# Patient Record
Sex: Female | Born: 1965 | Race: White | Marital: Single | Smoking: Never smoker
Health system: Southern US, Community
[De-identification: ages and names within clinical notes are randomized; demographics above are authoritative.]

## PROBLEM LIST (undated history)

## (undated) DIAGNOSIS — F329 Major depressive disorder, single episode, unspecified: Secondary | ICD-10-CM

## (undated) DIAGNOSIS — M869 Osteomyelitis, unspecified: Secondary | ICD-10-CM

## (undated) DIAGNOSIS — K5792 Diverticulitis of intestine, part unspecified, without perforation or abscess without bleeding: Secondary | ICD-10-CM

## (undated) DIAGNOSIS — F32A Depression, unspecified: Secondary | ICD-10-CM

## (undated) HISTORY — PX: HIP SURGERY: SHX245

## (undated) HISTORY — PX: ABDOMINAL HYSTERECTOMY: SHX81

## (undated) HISTORY — PX: ANKLE SURGERY: SHX546

---

## 2014-01-22 ENCOUNTER — Other Ambulatory Visit: Payer: Self-pay | Admitting: Gastroenterology

## 2014-01-22 DIAGNOSIS — R1032 Left lower quadrant pain: Secondary | ICD-10-CM

## 2014-01-24 ENCOUNTER — Ambulatory Visit
Admission: RE | Admit: 2014-01-24 | Discharge: 2014-01-24 | Disposition: A | Discharge status: Home / Self Care | Payer: 59 | Source: Ambulatory Visit | Attending: Gastroenterology | Admitting: Gastroenterology

## 2014-01-24 DIAGNOSIS — R1032 Left lower quadrant pain: Secondary | ICD-10-CM

## 2014-01-24 MED ORDER — IOHEXOL 300 MG/ML  SOLN
125.0000 mL | Freq: Once | INTRAMUSCULAR | Status: AC | PRN
  Administered 2014-01-24: 125 mL via INTRAVENOUS

## 2014-04-10 ENCOUNTER — Encounter (HOSPITAL_COMMUNITY): Payer: Self-pay | Admitting: Emergency Medicine

## 2014-04-10 ENCOUNTER — Inpatient Hospital Stay (HOSPITAL_COMMUNITY)
Admission: EM | Admit: 2014-04-10 | Discharge: 2014-04-13 | DRG: 418 | Disposition: A | Discharge status: Home / Self Care | Payer: 59 | Attending: General Surgery | Admitting: General Surgery

## 2014-04-10 DIAGNOSIS — K8050 Calculus of bile duct without cholangitis or cholecystitis without obstruction: Secondary | ICD-10-CM | POA: Diagnosis present

## 2014-04-10 DIAGNOSIS — K5900 Constipation, unspecified: Secondary | ICD-10-CM | POA: Diagnosis present

## 2014-04-10 DIAGNOSIS — K810 Acute cholecystitis: Secondary | ICD-10-CM | POA: Diagnosis present

## 2014-04-10 DIAGNOSIS — K8062 Calculus of gallbladder and bile duct with acute cholecystitis without obstruction: Principal | ICD-10-CM | POA: Diagnosis present

## 2014-04-10 DIAGNOSIS — Z6841 Body Mass Index (BMI) 40.0 and over, adult: Secondary | ICD-10-CM

## 2014-04-10 HISTORY — DX: Depression, unspecified: F32.A

## 2014-04-10 HISTORY — DX: Diverticulitis of intestine, part unspecified, without perforation or abscess without bleeding: K57.92

## 2014-04-10 HISTORY — DX: Osteomyelitis, unspecified: M86.9

## 2014-04-10 HISTORY — DX: Major depressive disorder, single episode, unspecified: F32.9

## 2014-04-10 NOTE — ED Notes (Signed)
Pt in c/o right abd pain and flank pain that started earlier today and has gotten progressively worse, denies n/v, pt visibly uncomfortable in triage, hx of diverticulitis but states this feels different

## 2014-04-11 ENCOUNTER — Emergency Department (HOSPITAL_COMMUNITY): Payer: 59

## 2014-04-11 ENCOUNTER — Inpatient Hospital Stay (HOSPITAL_COMMUNITY): Payer: 59

## 2014-04-11 ENCOUNTER — Encounter (HOSPITAL_COMMUNITY): Payer: Self-pay | Admitting: Radiology

## 2014-04-11 DIAGNOSIS — K811 Chronic cholecystitis: Secondary | ICD-10-CM

## 2014-04-11 DIAGNOSIS — K8050 Calculus of bile duct without cholangitis or cholecystitis without obstruction: Secondary | ICD-10-CM

## 2014-04-11 DIAGNOSIS — R1011 Right upper quadrant pain: Secondary | ICD-10-CM

## 2014-04-11 DIAGNOSIS — K8020 Calculus of gallbladder without cholecystitis without obstruction: Secondary | ICD-10-CM

## 2014-04-11 LAB — CBC WITH DIFFERENTIAL/PLATELET
BASOS ABS: 0 10*3/uL (ref 0.0–0.1)
BASOS PCT: 0 % (ref 0–1)
Basophils Absolute: 0 10*3/uL (ref 0.0–0.1)
Basophils Relative: 1 % (ref 0–1)
EOS ABS: 0.1 10*3/uL (ref 0.0–0.7)
Eosinophils Absolute: 0 10*3/uL (ref 0.0–0.7)
Eosinophils Relative: 1 % (ref 0–5)
Eosinophils Relative: 0 % (ref 0–5)
HCT: 39.4 % (ref 36.0–46.0)
HEMATOCRIT: 40 % (ref 36.0–46.0)
Hemoglobin: 13.7 g/dL (ref 12.0–15.0)
Hemoglobin: 13.2 g/dL (ref 12.0–15.0)
LYMPHS ABS: 1.7 10*3/uL (ref 0.7–4.0)
LYMPHS PCT: 21 % (ref 12–46)
Lymphocytes Relative: 19 % (ref 12–46)
Lymphs Abs: 2 10*3/uL (ref 0.7–4.0)
MCH: 30.6 pg (ref 26.0–34.0)
MCH: 29.8 pg (ref 26.0–34.0)
MCHC: 34.3 g/dL (ref 30.0–36.0)
MCHC: 33.5 g/dL (ref 30.0–36.0)
MCV: 89.5 fL (ref 78.0–100.0)
MCV: 88.9 fL (ref 78.0–100.0)
MONO ABS: 0.9 10*3/uL (ref 0.1–1.0)
Monocytes Absolute: 0.6 10*3/uL (ref 0.1–1.0)
Monocytes Relative: 9 % (ref 3–12)
Monocytes Relative: 7 % (ref 3–12)
NEUTROS PCT: 71 % (ref 43–77)
Neutro Abs: 7.5 10*3/uL (ref 1.7–7.7)
Neutro Abs: 5.5 10*3/uL (ref 1.7–7.7)
Neutrophils Relative %: 71 % (ref 43–77)
PLATELETS: 303 10*3/uL (ref 150–400)
Platelets: 287 10*3/uL (ref 150–400)
RBC: 4.47 MIL/uL (ref 3.87–5.11)
RBC: 4.43 MIL/uL (ref 3.87–5.11)
RDW: 13 % (ref 11.5–15.5)
RDW: 12.9 % (ref 11.5–15.5)
WBC: 10.5 10*3/uL (ref 4.0–10.5)
WBC: 7.8 10*3/uL (ref 4.0–10.5)

## 2014-04-11 LAB — COMPREHENSIVE METABOLIC PANEL
ALBUMIN: 3.9 g/dL (ref 3.5–5.2)
ALT: 31 U/L (ref 0–35)
ANION GAP: 14 (ref 5–15)
AST: 25 U/L (ref 0–37)
Alkaline Phosphatase: 53 U/L (ref 39–117)
BUN: 13 mg/dL (ref 6–23)
CALCIUM: 10 mg/dL (ref 8.4–10.5)
CO2: 26 mEq/L (ref 19–32)
CREATININE: 0.6 mg/dL (ref 0.50–1.10)
Chloride: 98 mEq/L (ref 96–112)
GFR calc Af Amer: >90 mL/min (ref >90)
GFR calc non Af Amer: >90 mL/min (ref >90)
Glucose, Bld: 115 mg/dL — ABNORMAL HIGH (ref 70–99)
Potassium: 4 mEq/L (ref 3.7–5.3)
Sodium: 138 mEq/L (ref 137–147)
TOTAL PROTEIN: 7.2 g/dL (ref 6.0–8.3)
Total Bilirubin: 0.3 mg/dL (ref 0.3–1.2)

## 2014-04-11 LAB — BASIC METABOLIC PANEL
ANION GAP: 14 (ref 5–15)
BUN: 10 mg/dL (ref 6–23)
CALCIUM: 9.7 mg/dL (ref 8.4–10.5)
CO2: 26 mEq/L (ref 19–32)
Chloride: 97 mEq/L (ref 96–112)
Creatinine, Ser: 0.59 mg/dL (ref 0.50–1.10)
Glucose, Bld: 115 mg/dL — ABNORMAL HIGH (ref 70–99)
POTASSIUM: 4.6 meq/L (ref 3.7–5.3)
SODIUM: 137 meq/L (ref 137–147)

## 2014-04-11 LAB — HEPATIC FUNCTION PANEL
ALBUMIN: 3.7 g/dL (ref 3.5–5.2)
ALK PHOS: 55 U/L (ref 39–117)
ALK PHOS: 54 U/L (ref 39–117)
ALT: 43 U/L — ABNORMAL HIGH (ref 0–35)
ALT: 39 U/L — AB (ref 0–35)
AST: 30 U/L (ref 0–37)
AST: 25 U/L (ref 0–37)
Albumin: 3.8 g/dL (ref 3.5–5.2)
BILIRUBIN TOTAL: 0.3 mg/dL (ref 0.3–1.2)
Bilirubin, Direct: <0.2 mg/dL (ref 0.0–0.3)
Bilirubin, Direct: <0.2 mg/dL (ref 0.0–0.3)
Total Bilirubin: 0.3 mg/dL (ref 0.3–1.2)
Total Protein: 7.2 g/dL (ref 6.0–8.3)
Total Protein: 7.2 g/dL (ref 6.0–8.3)

## 2014-04-11 LAB — LIPASE, BLOOD: Lipase: 42 U/L (ref 11–59)

## 2014-04-11 LAB — URINALYSIS, ROUTINE W REFLEX MICROSCOPIC
Bilirubin Urine: NEGATIVE
Glucose, UA: NEGATIVE mg/dL
HGB URINE DIPSTICK: NEGATIVE
Ketones, ur: NEGATIVE mg/dL
LEUKOCYTES UA: NEGATIVE
Nitrite: NEGATIVE
PROTEIN: NEGATIVE mg/dL
Specific Gravity, Urine: 1.02 (ref 1.005–1.030)
UROBILINOGEN UA: 0.2 mg/dL (ref 0.0–1.0)
pH: 6.5 (ref 5.0–8.0)

## 2014-04-11 LAB — TROPONIN I

## 2014-04-11 LAB — GLUCOSE, CAPILLARY: GLUCOSE-CAPILLARY: 126 mg/dL — AB (ref 70–99)

## 2014-04-11 MED ORDER — ACETAMINOPHEN 325 MG PO TABS: 650.0000 mg | ORAL_TABLET | Freq: Four times a day (QID) | ORAL | Status: DC | PRN

## 2014-04-11 MED ORDER — BIOTENE DRY MOUTH MT LIQD
15.0000 mL | Freq: Two times a day (BID) | OROMUCOSAL | Status: DC
  Administered 2014-04-13: 15 mL via OROMUCOSAL

## 2014-04-11 MED ORDER — ONDANSETRON HCL 4 MG PO TABS: 4.0000 mg | ORAL_TABLET | Freq: Four times a day (QID) | ORAL | Status: DC | PRN

## 2014-04-11 MED ORDER — IOHEXOL 300 MG/ML  SOLN
100.0000 mL | Freq: Once | INTRAMUSCULAR | Status: AC | PRN
  Administered 2014-04-11: 100 mL via INTRAVENOUS

## 2014-04-11 MED ORDER — ZOLPIDEM TARTRATE 5 MG PO TABS
5.0000 mg | ORAL_TABLET | Freq: Once | ORAL | Status: AC
  Administered 2014-04-11: 5 mg via ORAL
  Filled 2014-04-11: qty 1

## 2014-04-11 MED ORDER — MORPHINE SULFATE 2 MG/ML IJ SOLN
1.0000 mg | INTRAMUSCULAR | Status: DC | PRN
  Administered 2014-04-11: 1 mg via INTRAVENOUS
  Filled 2014-04-11: qty 1

## 2014-04-11 MED ORDER — ONDANSETRON HCL 4 MG/2ML IJ SOLN: 4.0000 mg | Freq: Four times a day (QID) | INTRAMUSCULAR | Status: DC | PRN

## 2014-04-11 MED ORDER — CHLORHEXIDINE GLUCONATE 0.12 % MT SOLN
15.0000 mL | Freq: Two times a day (BID) | OROMUCOSAL | Status: DC
  Administered 2014-04-11 – 2014-04-13 (×5): 15 mL via OROMUCOSAL
  Filled 2014-04-11 (×7): qty 15

## 2014-04-11 MED ORDER — GADOBENATE DIMEGLUMINE 529 MG/ML IV SOLN
20.0000 mL | Freq: Once | INTRAVENOUS | Status: AC
  Administered 2014-04-11: 20 mL via INTRAVENOUS

## 2014-04-11 MED ORDER — HYDROMORPHONE HCL PF 1 MG/ML IJ SOLN
1.0000 mg | Freq: Once | INTRAMUSCULAR | Status: AC
  Administered 2014-04-11: 1 mg via INTRAVENOUS
  Filled 2014-04-11: qty 1

## 2014-04-11 MED ORDER — MORPHINE SULFATE 4 MG/ML IJ SOLN
4.0000 mg | Freq: Once | INTRAMUSCULAR | Status: AC
  Administered 2014-04-11: 4 mg via INTRAVENOUS
  Filled 2014-04-11: qty 1

## 2014-04-11 MED ORDER — DEXTROSE-NACL 5-0.9 % IV SOLN
INTRAVENOUS | Status: AC
  Administered 2014-04-11 (×2): via INTRAVENOUS

## 2014-04-11 MED ORDER — METRONIDAZOLE IN NACL 5-0.79 MG/ML-% IV SOLN
500.0000 mg | Freq: Three times a day (TID) | INTRAVENOUS | Status: DC
  Administered 2014-04-11 – 2014-04-12 (×3): 500 mg via INTRAVENOUS
  Filled 2014-04-11 (×5): qty 100

## 2014-04-11 MED ORDER — ACETAMINOPHEN 650 MG RE SUPP: 650.0000 mg | Freq: Four times a day (QID) | RECTAL | Status: DC | PRN

## 2014-04-11 MED ORDER — ONDANSETRON HCL 4 MG/2ML IJ SOLN
4.0000 mg | Freq: Once | INTRAMUSCULAR | Status: AC
  Administered 2014-04-11: 4 mg via INTRAVENOUS
  Filled 2014-04-11: qty 2

## 2014-04-11 MED ORDER — CIPROFLOXACIN IN D5W 400 MG/200ML IV SOLN
400.0000 mg | Freq: Two times a day (BID) | INTRAVENOUS | Status: DC
  Administered 2014-04-11 – 2014-04-13 (×5): 400 mg via INTRAVENOUS
  Filled 2014-04-11 (×7): qty 200

## 2014-04-11 MED ORDER — IOHEXOL 300 MG/ML  SOLN
25.0000 mL | Freq: Once | INTRAMUSCULAR | Status: AC | PRN
  Administered 2014-04-11: 25 mL via ORAL

## 2014-04-11 NOTE — ED Notes (Signed)
CT tech called to inform them she had finished contrast

## 2014-04-11 NOTE — H&P (Signed)
[facility] History and Physical  Dominique Robinson ZOX:[id number] DOB: 1966/08/15 DOA: 04/10/2014  Referring physician: ER physician. PCP: No PCP Per Patient   Chief Complaint: Abdominal pain.  HPI: Dominique Markeratricia Ann Colaizzi is a 48 y.o. female with no significant past medical history presents with the ER because of abdominal pain. Patient's abdominal pain started last evening initially the epigastric area later started to radiate to the back and right upper quadrant. Patient did not have any nausea vomiting but had 2 episodes of diarrhea which was nonbloody. Patient denies any associated chest pain or shortness of breath. In the ER patient had CT abdomen and pelvis which shows choledocholithiasis and sonogram shows cholelithiasis. On-call gastroenterologist Dr. Bosie ClosSchooler was consulted by the ER physician and Dr. Bosie ClosSchooler will be seeing patient in consult for possible ERCP. Patient is admitted for further management. Patient was recently treated 2 months ago for diverticulitis.  Review of Systems: As presented in the history of presenting illness, rest negative.  Past Medical History  Diagnosis Date  . Diverticulitis   . Medical history non-contributory    Past Surgical History  Procedure Laterality Date  . Abdominal hysterectomy     Social History:  reports that she has never smoked. She does not have any smokeless tobacco history on file. She reports that she drinks alcohol. She reports that she does not use illicit drugs. Where does patient live home. Can patient participate in ADLs? Yes.  Allergies  Allergen Reactions  . Erythromycin Rash  . Penicillins Rash    Family History:  Family History  Problem Relation Age of Onset  . Hypertension Mother       Prior to Admission medications   Medication Sig Start Date End Date Taking? Authorizing Provider  acetaminophen (TYLENOL) 325 MG tablet Take 650 mg by mouth every 6 (six) hours as needed for mild pain.   Yes Historical  Provider, MD  Ascorbic Acid (VITAMIN C PO) Take 1 tablet by mouth daily.   Yes Historical Provider, MD  aspirin 325 MG tablet Take 650 mg by mouth daily.   Yes Historical Provider, MD  bismuth subsalicylate (PEPTO BISMOL) 262 MG/15ML suspension Take 30 mLs by mouth every 6 (six) hours as needed for indigestion.   Yes Historical Provider, MD  ibuprofen (ADVIL,MOTRIN) 200 MG tablet Take 400 mg by mouth every 6 (six) hours as needed for mild pain.   Yes Historical Provider, MD  Multiple Vitamin (MULTI VITAMIN DAILY PO) Take 1 tablet by mouth daily.   Yes Historical Provider, MD  Omega-3 Fatty Acids (FISH OIL PO) Take 1 capsule by mouth daily.   Yes Historical Provider, MD    Physical Exam: Filed Vitals:   04/11/14 0220 04/11/14 0300 04/11/14 0504 04/11/14 0615  BP: 140/78 140/86 127/80 132/79  Pulse: 71 65 81 78  Temp:    98.6 F (37 C)  TempSrc:    Oral  Resp: 21 16 14 14   Weight:      SpO2: 99% 99% 99% 98%     General:  Well-developed and nourished.  Eyes: Anicteric no pallor.  ENT: No discharge from the ears eyes nose mouth.  Neck: No mass felt.  Cardiovascular: S1-S2 heard.  Respiratory: No rhonchi or crepitations.  Abdomen: Mild tenderness epigastric area no guarding or rigidity.  Skin: No rash.  Musculoskeletal: No edema.  Psychiatric: Appears normal.  Neurologic: Alert awake oriented to time place and person. Moves all extremities.  Labs on Admission:  Basic Metabolic Panel:  Recent Labs Lab  04/11/14 0100  NA 138  K 4.0  CL 98  CO2 26  GLUCOSE 115*  BUN 13  CREATININE 0.60  CALCIUM 10.0   Liver Function Tests:  Recent Labs Lab 04/11/14 0100  AST 25  ALT 31  ALKPHOS 53  BILITOT 0.3  PROT 7.2  ALBUMIN 3.9    Recent Labs Lab 04/11/14 0100  LIPASE 42   No results found for this basename: AMMONIA,  in the last 168 hours CBC:  Recent Labs Lab 04/11/14 0100  WBC 10.5  NEUTROABS 7.5  HGB 13.7  HCT 40.0  MCV 89.5  PLT 287   Cardiac  Enzymes:  Recent Labs Lab 04/11/14 0100  TROPONINI <0.30    BNP (last 3 results) No results found for this basename: PROBNP,  in the last 8760 hours CBG: No results found for this basename: GLUCAP,  in the last 168 hours  Radiological Exams on Admission: Dg Chest 2 View  04/11/2014   CLINICAL DATA:  ABDOMINAL PAIN  EXAM: CHEST  2 VIEW  COMPARISON:  None.  FINDINGS: The cardiac and mediastinal silhouettes are within normal limits.  The lungs are normally inflated. No airspace consolidation, pleural effusion, or pulmonary edema is identified. There is no pneumothorax.  No acute osseous abnormality identified.  IMPRESSION: No active cardiopulmonary disease.   Electronically Signed   By: Rise MuBenjamin  McClintock M.D.   On: 04/11/2014 01:18   Ct Abdomen Pelvis W Contrast  04/11/2014   CLINICAL DATA:  Right abdominal pain and flank pain. History of diverticulitis.  EXAM: CT ABDOMEN AND PELVIS WITH CONTRAST  TECHNIQUE: Multidetector CT imaging of the abdomen and pelvis was performed using the standard protocol following bolus administration of intravenous contrast.  CONTRAST:  100mL OMNIPAQUE IOHEXOL 300 MG/ML  SOLN  COMPARISON:  Prior CT from 01/24/2014  FINDINGS: Mild subsegmental atelectasis seen dependently within the visualized lung bases.  Liver demonstrates a normal contrast enhanced appearance. Layering gallstones present within the gallbladder lumen. No overt evidence of acute cholecystitis at this time. 5 mm stone is present at the distal common bile duct/ampulla (series 201, image 34). The common bile duct is mildly dilated up to 9 mm. Mild intrahepatic biliary dilatation is present. No pancreatic ductal dilatation.  The spleen, adrenal glands, and pancreas demonstrate a normal contrast enhanced appearance.  Kidneys are equal in size with symmetric enhancement. No nephrolithiasis, hydronephrosis, or focal enhancing renal mass.  Stomach within normal limits. No evidence of bowel obstruction. Appendix  is normal. Colonic diverticulosis present without acute diverticulitis.  Bladder within normal limits. Uterus is absent. Ovaries within normal limits.  No free air or fluid. No enlarged intra-abdominal pelvic lymph nodes.  Normal intravascular enhancement seen throughout the abdomen and pelvis.  No acute osseous abnormality. No worrisome lytic or blastic osseous lesions.  IMPRESSION: 1. Cholelithiasis with impacted 5 mm stone in the distal common bile duct near the ampulla with mild intra and extrahepatic biliary dilatation. No overt CT evidence of acute cholecystitis at this time. Correlation with right upper quadrant ultrasound may be helpful for further evaluation. 2. No other acute intra-abdominal pelvic process.   Electronically Signed   By: Rise MuBenjamin  McClintock M.D.   On: 04/11/2014 04:25   [place] Abdomen Limited Ruq  04/11/2014   CLINICAL DATA:  Cholelithiasis  EXAM: US ABDOMEN LIMITED - RIGHT UPPER QUADRANT  COMPARISON:  Abdominal CT from the same day  FINDINGS: Gallbladder:  There are small (1 cm or smaller) gallstones which appear mobile. There is no wall  thickening or focal tenderness to suggest acute cholecystitis.  Common bile duct:  Diameter: 6 mm. Although the distal common duct is seen fairly well, a previously seen distal common bile duct calculus is not visible. This is likely a technical limitation rather than representing passage of stone.  Liver:  No focal lesion identified. Within normal limits in parenchymal echogenicity.  IMPRESSION: 1. Cholelithiasis without acute cholecystitis. 2. Choledocholithiasis noted on the preceding CT is not visible. This is likely a technical limitation, rather than an indication of stone passage.   Electronically Signed   By: Tiburcio Pea M.D.   On: 04/11/2014 06:20    Assessment/Plan Principal Problem:   Choledocholithiasis   1. Choledocholithiasis with cholelithiasis - patient has been placed n.p.o. in anticipation of possible procedure by  gastroenterologist. I have continued patient on gentle hydration and placeD patient on Cipro empirically for now. Patient will need surgical consult eventually. Follow LFTs and lipase.    Code Status: Full code.  Family Communication: None.  Disposition Plan: Admit to inpatient.    Capone Schwinn N. [facility] Pager [phone].  If 7PM-7AM, please contact night-coverage [email or web address] Password [facility] 04/11/2014, 6:37 AM

## 2014-04-11 NOTE — Progress Notes (Signed)
Pt arrived from unit via stretcher. Pt was oriented to room to unit and call bell system. VS were taken and are stable. Pt in no apparent distress at this time. Will continue to monitor pt.

## 2014-04-11 NOTE — ED Provider Notes (Signed)
CSN: [id number]     Arrival date & time 04/10/14  2349 History   First MD Initiated Contact with Patient 04/11/14 0000     Chief Complaint  Patient presents with  . Abdominal Pain     (Consider location/radiation/quality/duration/timing/severity/associated sxs/prior Treatment) HPI  This is a 48 yo female with history of diverticulitis who presents with abdominal pain. Patient reports onset of abdominal pain earlier today. She states that it started in her epigastrium and moved to her right side. It is worse laying flat. She denies any nausea or vomiting. She does endorse diarrhea yesterday that was nonbloody.  Currently patient rates her pain as 10. It has been constant. She has not noted any urinary symptoms including dysuria or hematuria.  She does not associate the pain with any food. No known history of kidney stones.  Past Medical History  Diagnosis Date  . Diverticulitis    History reviewed. No pertinent past surgical history. History reviewed. No pertinent family history. History  Substance Use Topics  . Smoking status: Never Smoker   . Smokeless tobacco: Not on file  . Alcohol Use: Not on file   OB History   Grav Para Term Preterm Abortions TAB SAB Ect Mult Living                 Review of Systems  Constitutional: Negative for fever.  Respiratory: Negative for cough, chest tightness and shortness of breath.   Cardiovascular: Negative for chest pain.  Gastrointestinal: Positive for abdominal pain and diarrhea. Negative for nausea and vomiting.  Genitourinary: Positive for flank pain. Negative for dysuria and hematuria.  Musculoskeletal: Negative for back pain.  Skin: Negative for wound.  Neurological: Negative for headaches.  All other systems reviewed and are negative.     Allergies  Erythromycin and Penicillins  Home Medications   Prior to Admission medications   Medication Sig Start Date End Date Taking? Authorizing Provider  acetaminophen (TYLENOL) 325 MG  tablet Take 650 mg by mouth every 6 (six) hours as needed for mild pain.   Yes Historical Provider, MD  Ascorbic Acid (VITAMIN C PO) Take 1 tablet by mouth daily.   Yes Historical Provider, MD  aspirin 325 MG tablet Take 650 mg by mouth daily.   Yes Historical Provider, MD  bismuth subsalicylate (PEPTO BISMOL) 262 MG/15ML suspension Take 30 mLs by mouth every 6 (six) hours as needed for indigestion.   Yes Historical Provider, MD  ibuprofen (ADVIL,MOTRIN) 200 MG tablet Take 400 mg by mouth every 6 (six) hours as needed for mild pain.   Yes Historical Provider, MD  Multiple Vitamin (MULTI VITAMIN DAILY PO) Take 1 tablet by mouth daily.   Yes Historical Provider, MD  Omega-3 Fatty Acids (FISH OIL PO) Take 1 capsule by mouth daily.   Yes Historical Provider, MD   BP 127/80  Pulse 81  Temp(Src) 98.1 F (36.7 C) (Oral)  Resp 14  Wt 220 lb (99.791 kg)  SpO2 99% Physical Exam  Nursing note and vitals reviewed. Constitutional: She is oriented to person, place, and time.  Uncomfortable appearing, no acute distress  HENT:  Head: Normocephalic and atraumatic.  Eyes: Pupils are equal, round, and reactive to light.  Cardiovascular: Normal rate, regular rhythm and normal heart sounds.   No murmur heard. Pulmonary/Chest: Effort normal and breath sounds normal. No respiratory distress. She has no wheezes.  Abdominal: Soft. Bowel sounds are normal. There is tenderness. There is no rebound and no guarding.  Mild tenderness of the  epigastrium without rebound or guarding  Neurological: She is alert and oriented to person, place, and time.  Skin: Skin is warm and dry.  Psychiatric: She has a normal mood and affect.    ED Course  Procedures (including critical care time) Labs Review Labs Reviewed  COMPREHENSIVE METABOLIC PANEL - Abnormal; Notable for the following:    Glucose, Bld 115 (*)    All other components within normal limits  CBC WITH DIFFERENTIAL  LIPASE, BLOOD  TROPONIN I  URINALYSIS,  ROUTINE W REFLEX MICROSCOPIC    Imaging Review Dg Chest 2 View  04/11/2014   CLINICAL DATA:  ABDOMINAL PAIN  EXAM: CHEST  2 VIEW  COMPARISON:  None.  FINDINGS: The cardiac and mediastinal silhouettes are within normal limits.  The lungs are normally inflated. No airspace consolidation, pleural effusion, or pulmonary edema is identified. There is no pneumothorax.  No acute osseous abnormality identified.  IMPRESSION: No active cardiopulmonary disease.   Electronically Signed   By: Rise MuBenjamin  McClintock M.D.   On: 04/11/2014 01:18   Ct Abdomen Pelvis W Contrast  04/11/2014   CLINICAL DATA:  Right abdominal pain and flank pain. History of diverticulitis.  EXAM: CT ABDOMEN AND PELVIS WITH CONTRAST  TECHNIQUE: Multidetector CT imaging of the abdomen and pelvis was performed using the standard protocol following bolus administration of intravenous contrast.  CONTRAST:  100mL OMNIPAQUE IOHEXOL 300 MG/ML  SOLN  COMPARISON:  Prior CT from 01/24/2014  FINDINGS: Mild subsegmental atelectasis seen dependently within the visualized lung bases.  Liver demonstrates a normal contrast enhanced appearance. Layering gallstones present within the gallbladder lumen. No overt evidence of acute cholecystitis at this time. 5 mm stone is present at the distal common bile duct/ampulla (series 201, image 34). The common bile duct is mildly dilated up to 9 mm. Mild intrahepatic biliary dilatation is present. No pancreatic ductal dilatation.  The spleen, adrenal glands, and pancreas demonstrate a normal contrast enhanced appearance.  Kidneys are equal in size with symmetric enhancement. No nephrolithiasis, hydronephrosis, or focal enhancing renal mass.  Stomach within normal limits. No evidence of bowel obstruction. Appendix is normal. Colonic diverticulosis present without acute diverticulitis.  Bladder within normal limits. Uterus is absent. Ovaries within normal limits.  No free air or fluid. No enlarged intra-abdominal pelvic lymph  nodes.  Normal intravascular enhancement seen throughout the abdomen and pelvis.  No acute osseous abnormality. No worrisome lytic or blastic osseous lesions.  IMPRESSION: 1. Cholelithiasis with impacted 5 mm stone in the distal common bile duct near the ampulla with mild intra and extrahepatic biliary dilatation. No overt CT evidence of acute cholecystitis at this time. Correlation with right upper quadrant ultrasound may be helpful for further evaluation. 2. No other acute intra-abdominal pelvic process.   Electronically Signed   By: Rise MuBenjamin  McClintock M.D.   On: 04/11/2014 04:25     EKG Interpretation   Date/Time:  Thursday April 11 2014 00:06:49 EDT Ventricular Rate:  55 PR Interval:  194 QRS Duration: 88 QT Interval:  475 QTC Calculation: 454 R Axis:   58 Text Interpretation:  Sinus arrhythmia ST elev, probable normal early  repol pattern No prior for comparison Confirmed by Nyema Hachey  MD, Raziyah Vanvleck  ([id number]) on 04/11/2014 1:39:14 AM      MDM   Final diagnoses:  Choledocholithiasis    Patient presents with abdominal pain. She is nontoxic but uncomfortable appearing on exam. She's afebrile and vital signs are reassuring. No significant tenderness on exam. Mild epigastric tenderness without signs of  peritonitis. Initial considerations include pancreatitis, gastritis, cholecystitis, right-sided kidney stone. Patient was given pain medication and nausea medicine.  Initial workup is reassuring including normal LFTs and lipase. Patient continues to report worsening abdominal pain. For this reason, CT scan of the abdomen was obtained. CT shows a 5 mm stone in impacted in the distal common bile duct.  The common bile duct appears to be dilated to 9 mm. Ultrasound was ordered. Patient's GI doctor is Dr. Carman Ching. Discussed with Dr. Bosie Clos who is on call for [facility]. Recommends admission for further evaluation and possible ERCP.  Patient is n.p.o. Patient does not have primary care physician.  Will admit to the hospitalist.    Shon Baton, MD 04/11/14 [phone]

## 2014-04-11 NOTE — ED Notes (Signed)
Patient transported to X-ray 

## 2014-04-11 NOTE — ED Notes (Signed)
Patient transported to CT 

## 2014-04-11 NOTE — Consult Note (Signed)
Cholelithiasis and has passed CBD stone. Will arrange for lap chole/IOC tomorrow. I will D/W Dr. Lindie SpruceWyatt. Procedure, risk, and benefits D/W her and she agrees. Patient examined and I agree with the assessment and plan  Violeta GelinasBurke Everly Rubalcava, MD, MPH, FACS Trauma: [phone] General Surgery: [phone]  04/11/2014 3:51 PM

## 2014-04-11 NOTE — Consult Note (Signed)
[facility] CONSULT Reason for consult: Abnormal CT Referring Physician: [facility]. PCP: none. Primary G.I.: Dr. Milagros Evener Dominique Robinson is an 48 y.o. female.  HPI: she has recently moved to the [employer] area from [place] to be near her son. 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 I saw her recently in the office about 6 weeks ago with some rectal bleeding constipation and marked left lower quadrant pain. Her clinical exam was consistent with diverticulitis. We obtain labs and her liver tests were normal at that time. She empirically was started on doxycycline BID and CT was obtained. This did reveal mild diverticulitis and sludge in the gallbladder. The patient had had no prior history of biliary disease or symptoms. She improved dramatically with the antibiotics and Miralax. She had a follow-up visit with me in the office next week to discuss diverticulitis, heme positive stools and questionable further workup, and gallstones. She has done well and was asymptomatic until yesterday when she began to have epigastric pain radiating to the back right upper quadrant. She had diarrhea. The pain was severe and doubled her over and in a completely different location than her diverticulitis pain. She came to the ER with the symptoms. CBC revealed white count 10.8 and completely normal liver tests. Specifically alkaline phosphatase, transaminases, and bilirubin were all completely normal yesterday as well as this morning. Lipase was normal. CT scan of the abdomen was repeated in the emergency room revealing gallstones of the gallbladder with what was felt to be a 5 mm stone in the distal common bile duct. The patient does clinically feel better. Ultrasound confirmed cholelthiasis but did not clearly show choledocholithiasis.  Past Medical History  Diagnosis Date  . Diverticulitis   . Medical history non-contributory     Past Surgical History  Procedure Laterality Date  . Abdominal  hysterectomy      Family History  Problem Relation Age of Onset  . Hypertension Mother     Social History:  reports that she has never smoked. She does not have any smokeless tobacco history on file. She reports that she drinks alcohol. She reports that she does not use illicit drugs.  Allergies:  Allergies  Allergen Reactions  . Erythromycin Rash  . Penicillins Rash    Medications; Prior to Admission medications   Medication Sig Start Date End Date Taking? Authorizing Provider  acetaminophen (TYLENOL) 325 MG tablet Take 650 mg by mouth every 6 (six) hours as needed for mild pain.   Yes Historical Provider, MD  Ascorbic Acid (VITAMIN C PO) Take 1 tablet by mouth daily.   Yes Historical Provider, MD  aspirin 325 MG tablet Take 650 mg by mouth daily.   Yes Historical Provider, MD  bismuth subsalicylate (PEPTO BISMOL) 262 MG/15ML suspension Take 30 mLs by mouth every 6 (six) hours as needed for indigestion.   Yes Historical Provider, MD  ibuprofen (ADVIL,MOTRIN) 200 MG tablet Take 400 mg by mouth every 6 (six) hours as needed for mild pain.   Yes Historical Provider, MD  Multiple Vitamin (MULTI VITAMIN DAILY PO) Take 1 tablet by mouth daily.   Yes Historical Provider, MD  Omega-3 Fatty Acids (FISH OIL PO) Take 1 capsule by mouth daily.   Yes Historical Provider, MD   . ciprofloxacin  400 mg Intravenous Q12H   PRN Meds acetaminophen, acetaminophen, morphine injection, ondansetron (ZOFRAN) IV, ondansetron Results for orders placed during the hospital encounter of 04/10/14 (from the past 48 hour(s))  URINALYSIS, ROUTINE W REFLEX MICROSCOPIC  Status: None   Collection Time    04/11/14 12:53 AM      Result Value Ref Range   Color, Urine YELLOW  YELLOW   APPearance CLEAR  CLEAR   Specific Gravity, Urine 1.020  1.005 - 1.030   pH 6.5  5.0 - 8.0   Glucose, UA NEGATIVE  NEGATIVE mg/dL   Hgb urine dipstick NEGATIVE  NEGATIVE   Bilirubin Urine NEGATIVE  NEGATIVE   Ketones, ur  NEGATIVE  NEGATIVE mg/dL   Protein, ur NEGATIVE  NEGATIVE mg/dL   Urobilinogen, UA 0.2  0.0 - 1.0 mg/dL   Nitrite NEGATIVE  NEGATIVE   Leukocytes, UA NEGATIVE  NEGATIVE   Comment: MICROSCOPIC NOT DONE ON URINES WITH NEGATIVE PROTEIN, BLOOD, LEUKOCYTES, NITRITE, OR GLUCOSE <1000 mg/dL.  CBC WITH DIFFERENTIAL     Status: None   Collection Time    04/11/14  1:00 AM      Result Value Ref Range   WBC 10.5  4.0 - 10.5 K/uL   RBC 4.47  3.87 - 5.11 MIL/uL   Hemoglobin 13.7  12.0 - 15.0 g/dL   HCT 40.0  36.0 - 46.0 %   MCV 89.5  78.0 - 100.0 fL   MCH 30.6  26.0 - 34.0 pg   MCHC 34.3  30.0 - 36.0 g/dL   RDW 13.0  11.5 - 15.5 %   Platelets 287  150 - 400 K/uL   Neutrophils Relative % 71  43 - 77 %   Neutro Abs 7.5  1.7 - 7.7 K/uL   Lymphocytes Relative 19  12 - 46 %   Lymphs Abs 2.0  0.7 - 4.0 K/uL   Monocytes Relative 9  3 - 12 %   Monocytes Absolute 0.9  0.1 - 1.0 K/uL   Eosinophils Relative 1  0 - 5 %   Eosinophils Absolute 0.1  0.0 - 0.7 K/uL   Basophils Relative 0  0 - 1 %   Basophils Absolute 0.0  0.0 - 0.1 K/uL  COMPREHENSIVE METABOLIC PANEL     Status: Abnormal   Collection Time    04/11/14  1:00 AM      Result Value Ref Range   Sodium 138  137 - 147 mEq/L   Potassium 4.0  3.7 - 5.3 mEq/L   Chloride 98  96 - 112 mEq/L   CO2 26  19 - 32 mEq/L   Glucose, Bld 115 (*) 70 - 99 mg/dL   BUN 13  6 - 23 mg/dL   Creatinine, Ser 0.60  0.50 - 1.10 mg/dL   Calcium 10.0  8.4 - 10.5 mg/dL   Total Protein 7.2  6.0 - 8.3 g/dL   Albumin 3.9  3.5 - 5.2 g/dL   AST 25  0 - 37 U/L   ALT 31  0 - 35 U/L   Alkaline Phosphatase 53  39 - 117 U/L   Total Bilirubin 0.3  0.3 - 1.2 mg/dL   GFR calc non Af Amer >90  >90 mL/min   GFR calc Af Amer >90  >90 mL/min   Comment: (NOTE)     The eGFR has been calculated using the CKD EPI equation.     This calculation has not been validated in all clinical situations.     eGFR's persistently <90 mL/min signify possible Chronic Kidney     Disease.   Anion  gap 14  5 - 15  LIPASE, BLOOD     Status: None   Collection Time  04/11/14  1:00 AM      Result Value Ref Range   Lipase 42  11 - 59 U/L  TROPONIN I     Status: None   Collection Time    04/11/14  1:00 AM      Result Value Ref Range   Troponin I <0.30  <0.30 ng/mL   Comment:            Due to the release kinetics of cTnI,     a negative result within the first hours     of the onset of symptoms does not rule out     myocardial infarction with certainty.     If myocardial infarction is still suspected,     repeat the test at appropriate intervals.  HEPATIC FUNCTION PANEL     Status: Abnormal   Collection Time    04/11/14  7:25 AM      Result Value Ref Range   Total Protein 7.2  6.0 - 8.3 g/dL   Albumin 3.8  3.5 - 5.2 g/dL   AST 30  0 - 37 U/L   ALT 43 (*) 0 - 35 U/L   Alkaline Phosphatase 55  39 - 117 U/L   Total Bilirubin 0.3  0.3 - 1.2 mg/dL   Bilirubin, Direct <0.2  0.0 - 0.3 mg/dL   Indirect Bilirubin NOT CALCULATED  0.3 - 0.9 mg/dL  BASIC METABOLIC PANEL     Status: Abnormal   Collection Time    04/11/14  7:25 AM      Result Value Ref Range   Sodium 137  137 - 147 mEq/L   Potassium 4.6  3.7 - 5.3 mEq/L   Chloride 97  96 - 112 mEq/L   CO2 26  19 - 32 mEq/L   Glucose, Bld 115 (*) 70 - 99 mg/dL   BUN 10  6 - 23 mg/dL   Creatinine, Ser 0.59  0.50 - 1.10 mg/dL   Calcium 9.7  8.4 - 10.5 mg/dL   GFR calc non Af Amer >90  >90 mL/min   GFR calc Af Amer >90  >90 mL/min   Comment: (NOTE)     The eGFR has been calculated using the CKD EPI equation.     This calculation has not been validated in all clinical situations.     eGFR's persistently <90 mL/min signify possible Chronic Kidney     Disease.   Anion gap 14  5 - 15  CBC WITH DIFFERENTIAL     Status: None   Collection Time    04/11/14  7:25 AM      Result Value Ref Range   WBC 7.8  4.0 - 10.5 K/uL   RBC 4.43  3.87 - 5.11 MIL/uL   Hemoglobin 13.2  12.0 - 15.0 g/dL   HCT 39.4  36.0 - 46.0 %   MCV 88.9  78.0 -  100.0 fL   MCH 29.8  26.0 - 34.0 pg   MCHC 33.5  30.0 - 36.0 g/dL   RDW 12.9  11.5 - 15.5 %   Platelets 303  150 - 400 K/uL   Neutrophils Relative % 71  43 - 77 %   Neutro Abs 5.5  1.7 - 7.7 K/uL   Lymphocytes Relative 21  12 - 46 %   Lymphs Abs 1.7  0.7 - 4.0 K/uL   Monocytes Relative 7  3 - 12 %   Monocytes Absolute 0.6  0.1 - 1.0 K/uL   Eosinophils Relative  0  0 - 5 %   Eosinophils Absolute 0.0  0.0 - 0.7 K/uL   Basophils Relative 1  0 - 1 %   Basophils Absolute 0.0  0.0 - 0.1 K/uL    Dg Chest 2 View  04/11/2014   CLINICAL DATA:  ABDOMINAL PAIN  EXAM: CHEST  2 VIEW  COMPARISON:  None.  FINDINGS: The cardiac and mediastinal silhouettes are within normal limits.  The lungs are normally inflated. No airspace consolidation, pleural effusion, or pulmonary edema is identified. There is no pneumothorax.  No acute osseous abnormality identified.  IMPRESSION: No active cardiopulmonary disease.   Electronically Signed   By: Jeannine Boga M.D.   On: 04/11/2014 01:18   Ct Abdomen Pelvis W Contrast  04/11/2014   CLINICAL DATA:  Right abdominal pain and flank pain. History of diverticulitis.  EXAM: CT ABDOMEN AND PELVIS WITH CONTRAST  TECHNIQUE: Multidetector CT imaging of the abdomen and pelvis was performed using the standard protocol following bolus administration of intravenous contrast.  CONTRAST:  124m OMNIPAQUE IOHEXOL 300 MG/ML  SOLN  COMPARISON:  Prior CT from 01/24/2014  FINDINGS: Mild subsegmental atelectasis seen dependently within the visualized lung bases.  Liver demonstrates a normal contrast enhanced appearance. Layering gallstones present within the gallbladder lumen. No overt evidence of acute cholecystitis at this time. 5 mm stone is present at the distal common bile duct/ampulla (series 201, image 34). The common bile duct is mildly dilated up to 9 mm. Mild intrahepatic biliary dilatation is present. No pancreatic ductal dilatation.  The spleen, adrenal glands, and pancreas  demonstrate a normal contrast enhanced appearance.  Kidneys are equal in size with symmetric enhancement. No nephrolithiasis, hydronephrosis, or focal enhancing renal mass.  Stomach within normal limits. No evidence of bowel obstruction. Appendix is normal. Colonic diverticulosis present without acute diverticulitis.  Bladder within normal limits. Uterus is absent. Ovaries within normal limits.  No free air or fluid. No enlarged intra-abdominal pelvic lymph nodes.  Normal intravascular enhancement seen throughout the abdomen and pelvis.  No acute osseous abnormality. No worrisome lytic or blastic osseous lesions.  IMPRESSION: 1. Cholelithiasis with impacted 5 mm stone in the distal common bile duct near the ampulla with mild intra and extrahepatic biliary dilatation. No overt CT evidence of acute cholecystitis at this time. Correlation with right upper quadrant ultrasound may be helpful for further evaluation. 2. No other acute intra-abdominal pelvic process.   Electronically Signed   By: BJeannine BogaM.D.   On: 04/11/2014 04:25   [place] Limited Ruq  04/11/2014   CLINICAL DATA:  Cholelithiasis  EXAM: [place] LIMITED - RIGHT UPPER QUADRANT  COMPARISON:  Abdominal CT from the same day  FINDINGS: Gallbladder:  There are small (1 cm or smaller) gallstones which appear mobile. There is no wall thickening or focal tenderness to suggest acute cholecystitis.  Common bile duct:  Diameter: 6 mm. Although the distal common duct is seen fairly well, a previously seen distal common bile duct calculus is not visible. This is likely a technical limitation rather than representing passage of stone.  Liver:  No focal lesion identified. Within normal limits in parenchymal echogenicity.  IMPRESSION: 1. Cholelithiasis without acute cholecystitis. 2. Choledocholithiasis noted on the preceding CT is not visible. This is likely a technical limitation, rather than an indication of stone passage.   Electronically Signed    By: JJorje GuildM.D.   On: 04/11/2014 06:20  Blood pressure 138/89, pulse 71, temperature 97.8 F (36.6 C), temperature source Oral, resp. rate 15, height 5' 1.2" (1.554 m), weight 105.507 kg (232 lb 9.6 oz), SpO2 99.00%.  Physical exam:   General-- white female somewhat heavy in no distress Heart-- regular rate and rhythm without murmurs are gallops Lungs--clear Abdomen-- obese, soft with mild upper tenderness few bowel sounds   Assessment: 1. Epigastric pain/cholelithiasis/questionable choledocholithiasis. Her symptoms are clearly different than her diverticulitis. It's not clear that there is impacted stone center liver tests are completely normal in 2 different tests. She may have passed a stone or it may be floating in the bile duct. In view of normal liver tests I think she should have MRCP prior to proceeding with ERCP. 2. Recent diverticulitis. This was a diagnosis may clinically confirmed by CT scan about 6 weeks ago. The patient did respond rapidly to antibiotic therapy 3. History rectal bleeding. Probably was due to constipation and will need to be addressed at some point in the future  Plan: 1. Would keep him PO for now go ahead with MRCP to determine if there is stone in the bile duct or if a stone has been passed. This will determine whether we need to proceed with ERCP or cholecystectomy. Have discussed this in detail with the patient including the procedure of ERCP and the risk of pancreatitis associated with it.   Tanekia Ryans JR,[place]eston Hankin L 04/11/2014, 10:00 AM

## 2014-04-11 NOTE — Consult Note (Signed)
[facility] Surgery Consult Note  Dominique Robinson [facility] [birth year]  [id number].    Requesting MD: Dr. Wynelle Cleveland Chief Complaint/Reason for Consult: Cholelithiasis  HPI:  48 y/o white female who presented to [facility] on 04/10/14 around midnight secondary to RUQ/epigastric abdominal pain.  The pain was severe 9/10 constant and sharp that radiated to her back.  Not associated with N/V, fevers or chills.  She had an episode of diarrhea the day before.  No trouble urinating or having BM's.  No CP/SOB.  She has never had pain like this before.  Denied any precipitating factors like fatty, greasy, or fried foods.  She tried aspirin, pepto-bismol, and tylenol without relief.  The pain became so severe that she came to the ER for evaluation.  Her only abdominal surgery was a lap hysterectomy.  No h/o hernias.  Recently treated for diverticulitis 2 months ago with 7 days of antibiotics with good relief.    ROS: All systems reviewed and otherwise negative except for as above  Family History  Problem Relation Age of Onset  . Hypertension Mother     Past Medical History  Diagnosis Date  . Diverticulitis   . Osteomyelitis   . Depression     Past Surgical History  Procedure Laterality Date  . Abdominal hysterectomy    . Ankle surgery N/A     CHILDHOOD  . Hip surgery Left     CHILDHOOD    Social History:  reports that she has never smoked. She has never used smokeless tobacco. She reports that she drinks alcohol. She reports that she does not use illicit drugs.  Allergies:  Allergies  Allergen Reactions  . Erythromycin Rash  . Penicillins Rash    Medications Prior to Admission  Medication Sig Dispense Refill  . acetaminophen (TYLENOL) 325 MG tablet Take 650 mg by mouth every 6 (six) hours as needed for mild pain.      . Ascorbic Acid (VITAMIN C PO) Take 1 tablet by mouth daily.      Marland Kitchen aspirin 325 MG tablet Take 650 mg by mouth daily.      Marland Kitchen bismuth subsalicylate (PEPTO BISMOL) 262 MG/15ML  suspension Take 30 mLs by mouth every 6 (six) hours as needed for indigestion.      Marland Kitchen ibuprofen (ADVIL,MOTRIN) 200 MG tablet Take 400 mg by mouth every 6 (six) hours as needed for mild pain.      . Multiple Vitamin (MULTI VITAMIN DAILY PO) Take 1 tablet by mouth daily.      . Omega-3 Fatty Acids (FISH OIL PO) Take 1 capsule by mouth daily.        Blood pressure 134/80, pulse 70, temperature 98 F (36.7 C), temperature source Oral, resp. rate 20, height 5' 1.2" (1.554 m), weight 232 lb 9.6 oz (105.507 kg), SpO2 100.00%. Physical Exam: General: pleasant, WD/WN white female who is laying in bed in NAD HEENT: head is normocephalic, atraumatic.  Sclera are noninjected.  PERRL.  Ears and nose without any masses or lesions.  Mouth is pink and moist Heart: regular, rate, and rhythm.  No obvious murmurs, gallops, or rubs noted.  Palpable pedal pulses bilaterally Lungs: CTAB, no wheezes, rhonchi, or rales noted.  Respiratory effort nonlabored Abd: soft, mild tenderness in RUQ and epigastrium, +BS, no masses, hernias, or organomegaly, well healed scar umbilicus, could not visualize the other 2 incision sites at her lateral abdomen from lap hysterectomy  MS: all 4 extremities are symmetrical with no cyanosis, clubbing, or edema. Skin: warm  and dry with no masses, lesions, or rashes Psych: A&Ox3 with an appropriate affect.   Results for orders placed during the hospital encounter of 04/10/14 (from the past 48 hour(s))  URINALYSIS, ROUTINE W REFLEX MICROSCOPIC     Status: None   Collection Time    04/11/14 12:53 AM      Result Value Ref Range   Color, Urine YELLOW  YELLOW   APPearance CLEAR  CLEAR   Specific Gravity, Urine 1.020  1.005 - 1.030   pH 6.5  5.0 - 8.0   Glucose, UA NEGATIVE  NEGATIVE mg/dL   Hgb urine dipstick NEGATIVE  NEGATIVE   Bilirubin Urine NEGATIVE  NEGATIVE   Ketones, ur NEGATIVE  NEGATIVE mg/dL   Protein, ur NEGATIVE  NEGATIVE mg/dL   Urobilinogen, UA 0.2  0.0 - 1.0 mg/dL    Nitrite NEGATIVE  NEGATIVE   Leukocytes, UA NEGATIVE  NEGATIVE   Comment: MICROSCOPIC NOT DONE ON URINES WITH NEGATIVE PROTEIN, BLOOD, LEUKOCYTES, NITRITE, OR GLUCOSE <1000 mg/dL.  CBC WITH DIFFERENTIAL     Status: None   Collection Time    04/11/14  1:00 AM      Result Value Ref Range   WBC 10.5  4.0 - 10.5 K/uL   RBC 4.47  3.87 - 5.11 MIL/uL   Hemoglobin 13.7  12.0 - 15.0 g/dL   HCT 40.0  36.0 - 46.0 %   MCV 89.5  78.0 - 100.0 fL   MCH 30.6  26.0 - 34.0 pg   MCHC 34.3  30.0 - 36.0 g/dL   RDW 13.0  11.5 - 15.5 %   Platelets 287  150 - 400 K/uL   Neutrophils Relative % 71  43 - 77 %   Neutro Abs 7.5  1.7 - 7.7 K/uL   Lymphocytes Relative 19  12 - 46 %   Lymphs Abs 2.0  0.7 - 4.0 K/uL   Monocytes Relative 9  3 - 12 %   Monocytes Absolute 0.9  0.1 - 1.0 K/uL   Eosinophils Relative 1  0 - 5 %   Eosinophils Absolute 0.1  0.0 - 0.7 K/uL   Basophils Relative 0  0 - 1 %   Basophils Absolute 0.0  0.0 - 0.1 K/uL  COMPREHENSIVE METABOLIC PANEL     Status: Abnormal   Collection Time    04/11/14  1:00 AM      Result Value Ref Range   Sodium 138  137 - 147 mEq/L   Potassium 4.0  3.7 - 5.3 mEq/L   Chloride 98  96 - 112 mEq/L   CO2 26  19 - 32 mEq/L   Glucose, Bld 115 (*) 70 - 99 mg/dL   BUN 13  6 - 23 mg/dL   Creatinine, Ser 0.60  0.50 - 1.10 mg/dL   Calcium 10.0  8.4 - 10.5 mg/dL   Total Protein 7.2  6.0 - 8.3 g/dL   Albumin 3.9  3.5 - 5.2 g/dL   AST 25  0 - 37 U/L   ALT 31  0 - 35 U/L   Alkaline Phosphatase 53  39 - 117 U/L   Total Bilirubin 0.3  0.3 - 1.2 mg/dL   GFR calc non Af Amer >90  >90 mL/min   GFR calc Af Amer >90  >90 mL/min   Comment: (NOTE)     The eGFR has been calculated using the CKD EPI equation.     This calculation has not been validated in all clinical situations.  eGFR's persistently <90 mL/min signify possible Chronic Kidney     Disease.   Anion gap 14  5 - 15  LIPASE, BLOOD     Status: None   Collection Time    04/11/14  1:00 AM      Result Value  Ref Range   Lipase 42  11 - 59 U/L  TROPONIN I     Status: None   Collection Time    04/11/14  1:00 AM      Result Value Ref Range   Troponin I <0.30  <0.30 ng/mL   Comment:            Due to the release kinetics of cTnI,     a negative result within the first hours     of the onset of symptoms does not rule out     myocardial infarction with certainty.     If myocardial infarction is still suspected,     repeat the test at appropriate intervals.  HEPATIC FUNCTION PANEL     Status: Abnormal   Collection Time    04/11/14  7:25 AM      Result Value Ref Range   Total Protein 7.2  6.0 - 8.3 g/dL   Albumin 3.8  3.5 - 5.2 g/dL   AST 30  0 - 37 U/L   ALT 43 (*) 0 - 35 U/L   Alkaline Phosphatase 55  39 - 117 U/L   Total Bilirubin 0.3  0.3 - 1.2 mg/dL   Bilirubin, Direct <0.2  0.0 - 0.3 mg/dL   Indirect Bilirubin NOT CALCULATED  0.3 - 0.9 mg/dL  BASIC METABOLIC PANEL     Status: Abnormal   Collection Time    04/11/14  7:25 AM      Result Value Ref Range   Sodium 137  137 - 147 mEq/L   Potassium 4.6  3.7 - 5.3 mEq/L   Chloride 97  96 - 112 mEq/L   CO2 26  19 - 32 mEq/L   Glucose, Bld 115 (*) 70 - 99 mg/dL   BUN 10  6 - 23 mg/dL   Creatinine, Ser 0.59  0.50 - 1.10 mg/dL   Calcium 9.7  8.4 - 10.5 mg/dL   GFR calc non Af Amer >90  >90 mL/min   GFR calc Af Amer >90  >90 mL/min   Comment: (NOTE)     The eGFR has been calculated using the CKD EPI equation.     This calculation has not been validated in all clinical situations.     eGFR's persistently <90 mL/min signify possible Chronic Kidney     Disease.   Anion gap 14  5 - 15  CBC WITH DIFFERENTIAL     Status: None   Collection Time    04/11/14  7:25 AM      Result Value Ref Range   WBC 7.8  4.0 - 10.5 K/uL   RBC 4.43  3.87 - 5.11 MIL/uL   Hemoglobin 13.2  12.0 - 15.0 g/dL   HCT 39.4  36.0 - 46.0 %   MCV 88.9  78.0 - 100.0 fL   MCH 29.8  26.0 - 34.0 pg   MCHC 33.5  30.0 - 36.0 g/dL   RDW 12.9  11.5 - 15.5 %   Platelets 303   150 - 400 K/uL   Neutrophils Relative % 71  43 - 77 %   Neutro Abs 5.5  1.7 - 7.7 K/uL   Lymphocytes Relative 21  12 - 46 %   Lymphs Abs 1.7  0.7 - 4.0 K/uL   Monocytes Relative 7  3 - 12 %   Monocytes Absolute 0.6  0.1 - 1.0 K/uL   Eosinophils Relative 0  0 - 5 %   Eosinophils Absolute 0.0  0.0 - 0.7 K/uL   Basophils Relative 1  0 - 1 %   Basophils Absolute 0.0  0.0 - 0.1 K/uL  GLUCOSE, CAPILLARY     Status: Abnormal   Collection Time    04/11/14 11:54 AM      Result Value Ref Range   Glucose-Capillary 126 (*) 70 - 99 mg/dL   Dg Chest 2 View  04/11/2014   CLINICAL DATA:  ABDOMINAL PAIN  EXAM: CHEST  2 VIEW  COMPARISON:  None.  FINDINGS: The cardiac and mediastinal silhouettes are within normal limits.  The lungs are normally inflated. No airspace consolidation, pleural effusion, or pulmonary edema is identified. There is no pneumothorax.  No acute osseous abnormality identified.  IMPRESSION: No active cardiopulmonary disease.   Electronically Signed   By: Jeannine Boga M.D.   On: 04/11/2014 01:18   Ct Abdomen Pelvis W Contrast  04/11/2014   CLINICAL DATA:  Right abdominal pain and flank pain. History of diverticulitis.  EXAM: CT ABDOMEN AND PELVIS WITH CONTRAST  TECHNIQUE: Multidetector CT imaging of the abdomen and pelvis was performed using the standard protocol following bolus administration of intravenous contrast.  CONTRAST:  17m OMNIPAQUE IOHEXOL 300 MG/ML  SOLN  COMPARISON:  Prior CT from 01/24/2014  FINDINGS: Mild subsegmental atelectasis seen dependently within the visualized lung bases.  Liver demonstrates a normal contrast enhanced appearance. Layering gallstones present within the gallbladder lumen. No overt evidence of acute cholecystitis at this time. 5 mm stone is present at the distal common bile duct/ampulla (series 201, image 34). The common bile duct is mildly dilated up to 9 mm. Mild intrahepatic biliary dilatation is present. No pancreatic ductal dilatation.  The  spleen, adrenal glands, and pancreas demonstrate a normal contrast enhanced appearance.  Kidneys are equal in size with symmetric enhancement. No nephrolithiasis, hydronephrosis, or focal enhancing renal mass.  Stomach within normal limits. No evidence of bowel obstruction. Appendix is normal. Colonic diverticulosis present without acute diverticulitis.  Bladder within normal limits. Uterus is absent. Ovaries within normal limits.  No free air or fluid. No enlarged intra-abdominal pelvic lymph nodes.  Normal intravascular enhancement seen throughout the abdomen and pelvis.  No acute osseous abnormality. No worrisome lytic or blastic osseous lesions.  IMPRESSION: 1. Cholelithiasis with impacted 5 mm stone in the distal common bile duct near the ampulla with mild intra and extrahepatic biliary dilatation. No overt CT evidence of acute cholecystitis at this time. Correlation with right upper quadrant ultrasound may be helpful for further evaluation. 2. No other acute intra-abdominal pelvic process.   Electronically Signed   By: BJeannine BogaM.D.   On: 04/11/2014 04:25   Mr 3d Recon At Scanner  04/11/2014   CLINICAL DATA:  Right upper quadrant abdominal pain. Cholelithiasis and choledocholithiasis on prior ultrasound and CT.  EXAM: MRI ABDOMEN WITHOUT AND WITH CONTRAST (INCLUDING MRCP)  TECHNIQUE: Multiplanar multisequence MR imaging of the abdomen was performed both before and after the administration of intravenous contrast. Heavily T2-weighted images of the biliary and pancreatic ducts were obtained, and three-dimensional MRCP images were rendered by post processing.  CONTRAST:  251mMULTIHANCE GADOBENATE DIMEGLUMINE 529 MG/ML IV SOLN  COMPARISON:  Abdominal CT and ultrasound of earlier  today.  FINDINGS: Normal heart size without pericardial or pleural effusion. Normal liver, spleen, stomach, pancreas, adrenal glands, kidneys.  Multiple small gallstones without wall thickening or pericholecystic fluid.  Similar to mild improvement in borderline intrahepatic biliary ductal dilatation. The common duct dilatation has resolved. For example, the common duct measures 6 mm on image 28/series 3 in the pancreatic head. 9 mm at the same level on the prior CT. Maximally 6 mm on image 53/ series 8. No evidence of residual choledocholithiasis. No pancreatic ductal dilatation.  No abdominal adenopathy or ascites. Normal small bowel loops. Colonic stool burden suggests constipation. Probable hemangioma within the T11 vertebral body.  IMPRESSION: 1. Resolution of common duct dilatation with presumed passage of common duct stone. 2. Cholelithiasis. 3.  Possible constipation.   Electronically Signed   By: Abigail Miyamoto M.D.   On: 04/11/2014 13:48   Mr Abd W/wo Cm/mrcp  04/11/2014   CLINICAL DATA:  Right upper quadrant abdominal pain. Cholelithiasis and choledocholithiasis on prior ultrasound and CT.  EXAM: MRI ABDOMEN WITHOUT AND WITH CONTRAST (INCLUDING MRCP)  TECHNIQUE: Multiplanar multisequence MR imaging of the abdomen was performed both before and after the administration of intravenous contrast. Heavily T2-weighted images of the biliary and pancreatic ducts were obtained, and three-dimensional MRCP images were rendered by post processing.  CONTRAST:  88m MULTIHANCE GADOBENATE DIMEGLUMINE 529 MG/ML IV SOLN  COMPARISON:  Abdominal CT and ultrasound of earlier today.  FINDINGS: Normal heart size without pericardial or pleural effusion. Normal liver, spleen, stomach, pancreas, adrenal glands, kidneys.  Multiple small gallstones without wall thickening or pericholecystic fluid. Similar to mild improvement in borderline intrahepatic biliary ductal dilatation. The common duct dilatation has resolved. For example, the common duct measures 6 mm on image 28/series 3 in the pancreatic head. 9 mm at the same level on the prior CT. Maximally 6 mm on image 53/ series 8. No evidence of residual choledocholithiasis. No pancreatic ductal  dilatation.  No abdominal adenopathy or ascites. Normal small bowel loops. Colonic stool burden suggests constipation. Probable hemangioma within the T11 vertebral body.  IMPRESSION: 1. Resolution of common duct dilatation with presumed passage of common duct stone. 2. Cholelithiasis. 3.  Possible constipation.   Electronically Signed   By: KAbigail MiyamotoM.D.   On: 04/11/2014 13:48   [place]domen Limited Ruq  04/11/2014   CLINICAL DATA:  Cholelithiasis  EXAM: [place]DOMEN LIMITED - RIGHT UPPER QUADRANT  COMPARISON:  Abdominal CT from the same day  FINDINGS: Gallbladder:  There are small (1 cm or smaller) gallstones which appear mobile. There is no wall thickening or focal tenderness to suggest acute cholecystitis.  Common bile duct:  Diameter: 6 mm. Although the distal common duct is seen fairly well, a previously seen distal common bile duct calculus is not visible. This is likely a technical limitation rather than representing passage of stone.  Liver:  No focal lesion identified. Within normal limits in parenchymal echogenicity.  IMPRESSION: 1. Cholelithiasis without acute cholecystitis. 2. Choledocholithiasis noted on the preceding CT is not visible. This is likely a technical limitation, rather than an indication of stone passage.   Electronically Signed   By: JJorje GuildM.D.   On: 04/11/2014 06:20      Assessment/Plan Cholelithiasis with choledocholithiasis, no evidence of cholecystitis RUQ abdominal pain H/o diverticulitis - treated 2 mo ago H/o abdominal hysterectomy  Plan: 1.  No evidence of cholecystitis.  Labs/vitals normal except mild elevation in ALT (43).  Will need lap chole this admission,  hopefully tomorrow 2.  Clears, NPO after MN, IVF, pain control, antiemetics, already on Cipro/flagyl even though WBC and temp is normal 3.  SCD's and hold dvt prophylaxis for anticipated surgery 4.  Ambulate and IS 5.  Given no evidence of cholecystitis may be able to d/c tomorrow after the  operation or Saturday am if all goes well. 6.  Discussed benefits of surgery and possible complications including:  Bleeding, infection, risk of injury to other structures, conversion to open, or bile leak.  She agrees to proceed with surgery tomorrow.    Coralie Keens, [facility] Surgery 04/11/2014, 2:48 PM Pager: [phone]

## 2014-04-11 NOTE — ED Notes (Signed)
Pt placed in gown and in bed. PT monitored by pulse ox, bp cuff, and 12-lead. 

## 2014-04-11 NOTE — Progress Notes (Addendum)
[facility]  Brief Narrative:  48 y/o female admitted this AM with h/o diverticulitis admitted with RUQ pain starting last night with a CT abd showing choledocholithiasis and sonogram shows cholelithiasis. Gi consulted. MRCP has been ordered.   Exam: RUQ tender, BS+ no distension   Principal Problem:   Choledocholithiasis with cholecystitis - cont Cipro- add Flagyl - MRCP ordered per Dr Randa EvensEdwards- repeat LFTs later today- oddly they are normal except for a mild ALT elevation - NPO  -IVF and pain control with Morphine.   Dominique CantorSaima Cambrey Lupi, MD

## 2014-04-11 NOTE — ED Notes (Signed)
Patient transported to Ultrasound 

## 2014-04-12 ENCOUNTER — Inpatient Hospital Stay (HOSPITAL_COMMUNITY): Payer: 59

## 2014-04-12 ENCOUNTER — Encounter (HOSPITAL_COMMUNITY)
Admission: EM | Disposition: A | Discharge status: Home / Self Care | Payer: Self-pay | Source: Home / Self Care | Attending: Internal Medicine

## 2014-04-12 ENCOUNTER — Encounter (HOSPITAL_COMMUNITY): Payer: 59 | Admitting: Anesthesiology

## 2014-04-12 ENCOUNTER — Encounter (HOSPITAL_COMMUNITY): Payer: Self-pay | Admitting: Certified Registered Nurse Anesthetist

## 2014-04-12 ENCOUNTER — Inpatient Hospital Stay (HOSPITAL_COMMUNITY): Payer: 59 | Admitting: Anesthesiology

## 2014-04-12 HISTORY — PX: CHOLECYSTECTOMY: SHX55

## 2014-04-12 LAB — CBC
HEMATOCRIT: 38.2 % (ref 36.0–46.0)
Hemoglobin: 12.8 g/dL (ref 12.0–15.0)
MCH: 30.5 pg (ref 26.0–34.0)
MCHC: 33.5 g/dL (ref 30.0–36.0)
MCV: 91 fL (ref 78.0–100.0)
Platelets: 273 10*3/uL (ref 150–400)
RBC: 4.2 MIL/uL (ref 3.87–5.11)
RDW: 13.4 % (ref 11.5–15.5)
WBC: 5.6 10*3/uL (ref 4.0–10.5)

## 2014-04-12 LAB — COMPREHENSIVE METABOLIC PANEL
ALK PHOS: 50 U/L (ref 39–117)
ALT: 33 U/L (ref 0–35)
AST: 17 U/L (ref 0–37)
Albumin: 3.3 g/dL — ABNORMAL LOW (ref 3.5–5.2)
Anion gap: 13 (ref 5–15)
BUN: 7 mg/dL (ref 6–23)
CO2: 25 mEq/L (ref 19–32)
Calcium: 8.6 mg/dL (ref 8.4–10.5)
Chloride: 104 mEq/L (ref 96–112)
Creatinine, Ser: 0.63 mg/dL (ref 0.50–1.10)
GFR calc Af Amer: >90 mL/min (ref >90)
GLUCOSE: 117 mg/dL — AB (ref 70–99)
POTASSIUM: 3.7 meq/L (ref 3.7–5.3)
SODIUM: 142 meq/L (ref 137–147)
Total Bilirubin: 0.3 mg/dL (ref 0.3–1.2)
Total Protein: 6.4 g/dL (ref 6.0–8.3)

## 2014-04-12 LAB — SURGICAL PCR SCREEN
MRSA, PCR: NEGATIVE
Staphylococcus aureus: POSITIVE — AB

## 2014-04-12 SURGERY — LAPAROSCOPIC CHOLECYSTECTOMY WITH INTRAOPERATIVE CHOLANGIOGRAM
Anesthesia: General

## 2014-04-12 MED ORDER — MUPIROCIN 2 % EX OINT
1.0000 "application " | TOPICAL_OINTMENT | Freq: Two times a day (BID) | CUTANEOUS | Status: DC
  Administered 2014-04-12 – 2014-04-13 (×2): 1 via NASAL
  Filled 2014-04-12: qty 22

## 2014-04-12 MED ORDER — CHLORHEXIDINE GLUCONATE CLOTH 2 % EX PADS
6.0000 | MEDICATED_PAD | Freq: Every day | CUTANEOUS | Status: DC
  Administered 2014-04-13: 6 via TOPICAL

## 2014-04-12 MED ORDER — SODIUM CHLORIDE 0.9 % IV SOLN
INTRAVENOUS | Status: DC | PRN
  Administered 2014-04-12: 10:00:00

## 2014-04-12 MED ORDER — BUPIVACAINE-EPINEPHRINE 0.25% -1:200000 IJ SOLN
INTRAMUSCULAR | Status: DC | PRN
  Administered 2014-04-12: 20 mL

## 2014-04-12 MED ORDER — HYDROMORPHONE HCL PF 1 MG/ML IJ SOLN
INTRAMUSCULAR | Status: AC
  Administered 2014-04-12: 0.5 mg via INTRAVENOUS
  Filled 2014-04-12: qty 1

## 2014-04-12 MED ORDER — GLYCOPYRROLATE 0.2 MG/ML IJ SOLN
INTRAMUSCULAR | Status: DC | PRN
  Administered 2014-04-12: .6 mg via INTRAVENOUS

## 2014-04-12 MED ORDER — MIDAZOLAM HCL 2 MG/2ML IJ SOLN
INTRAMUSCULAR | Status: AC
  Filled 2014-04-12: qty 2

## 2014-04-12 MED ORDER — HYDROMORPHONE HCL PF 1 MG/ML IJ SOLN
0.5000 mg | INTRAMUSCULAR | Status: DC | PRN
  Administered 2014-04-12 (×2): 1 mg via INTRAVENOUS
  Filled 2014-04-12 (×2): qty 1

## 2014-04-12 MED ORDER — SUCCINYLCHOLINE CHLORIDE 20 MG/ML IJ SOLN
INTRAMUSCULAR | Status: DC | PRN
  Administered 2014-04-12: 100 mg via INTRAVENOUS

## 2014-04-12 MED ORDER — ONDANSETRON HCL 4 MG/2ML IJ SOLN
INTRAMUSCULAR | Status: AC
  Filled 2014-04-12: qty 2

## 2014-04-12 MED ORDER — LACTATED RINGERS IV SOLN
INTRAVENOUS | Status: DC | PRN
  Administered 2014-04-12 (×2): via INTRAVENOUS

## 2014-04-12 MED ORDER — NEOSTIGMINE METHYLSULFATE 10 MG/10ML IV SOLN
INTRAVENOUS | Status: DC | PRN
  Administered 2014-04-12: 4 mg via INTRAVENOUS

## 2014-04-12 MED ORDER — ONDANSETRON HCL 4 MG/2ML IJ SOLN
INTRAMUSCULAR | Status: DC | PRN
  Administered 2014-04-12: 4 mg via INTRAVENOUS

## 2014-04-12 MED ORDER — FENTANYL CITRATE 0.05 MG/ML IJ SOLN
INTRAMUSCULAR | Status: DC | PRN
  Administered 2014-04-12: 50 ug via INTRAVENOUS
  Administered 2014-04-12 (×2): 100 ug via INTRAVENOUS

## 2014-04-12 MED ORDER — OXYCODONE-ACETAMINOPHEN 5-325 MG PO TABS
1.0000 | ORAL_TABLET | ORAL | Status: DC | PRN
  Administered 2014-04-12 – 2014-04-13 (×4): 2 via ORAL
  Filled 2014-04-12 (×3): qty 2

## 2014-04-12 MED ORDER — PROPOFOL 10 MG/ML IV BOLUS
INTRAVENOUS | Status: AC
  Filled 2014-04-12: qty 20

## 2014-04-12 MED ORDER — NEOSTIGMINE METHYLSULFATE 10 MG/10ML IV SOLN
INTRAVENOUS | Status: AC
  Filled 2014-04-12: qty 1

## 2014-04-12 MED ORDER — LIDOCAINE HCL (CARDIAC) 20 MG/ML IV SOLN
INTRAVENOUS | Status: DC | PRN
  Administered 2014-04-12: 40 mg via INTRAVENOUS

## 2014-04-12 MED ORDER — LACTATED RINGERS IV SOLN: INTRAVENOUS | Status: DC

## 2014-04-12 MED ORDER — 0.9 % SODIUM CHLORIDE (POUR BTL) OPTIME
TOPICAL | Status: DC | PRN
  Administered 2014-04-12: 1000 mL

## 2014-04-12 MED ORDER — PROPOFOL 10 MG/ML IV BOLUS
INTRAVENOUS | Status: DC | PRN
  Administered 2014-04-12: 150 mg via INTRAVENOUS
  Administered 2014-04-12: 50 mg via INTRAVENOUS

## 2014-04-12 MED ORDER — FENTANYL CITRATE 0.05 MG/ML IJ SOLN
INTRAMUSCULAR | Status: AC
  Filled 2014-04-12: qty 5

## 2014-04-12 MED ORDER — MIDAZOLAM HCL 5 MG/5ML IJ SOLN
INTRAMUSCULAR | Status: DC | PRN
  Administered 2014-04-12: 2 mg via INTRAVENOUS

## 2014-04-12 MED ORDER — SODIUM CHLORIDE 0.9 % IR SOLN
Status: DC | PRN
  Administered 2014-04-12: 1000 mL

## 2014-04-12 MED ORDER — OXYCODONE HCL 5 MG PO TABS
5.0000 mg | ORAL_TABLET | Freq: Once | ORAL | Status: AC | PRN
Start: 2014-04-12 — End: 2014-04-12

## 2014-04-12 MED ORDER — ENOXAPARIN SODIUM 40 MG/0.4ML ~~LOC~~ SOLN
40.0000 mg | SUBCUTANEOUS | Status: DC
  Administered 2014-04-13: 40 mg via SUBCUTANEOUS
  Filled 2014-04-12 (×2): qty 0.4

## 2014-04-12 MED ORDER — ROCURONIUM BROMIDE 100 MG/10ML IV SOLN
INTRAVENOUS | Status: DC | PRN
  Administered 2014-04-12: 30 mg via INTRAVENOUS
  Administered 2014-04-12: 10 mg via INTRAVENOUS

## 2014-04-12 MED ORDER — HYDROMORPHONE HCL PF 1 MG/ML IJ SOLN
0.2500 mg | INTRAMUSCULAR | Status: DC | PRN
  Administered 2014-04-12 (×4): 0.5 mg via INTRAVENOUS

## 2014-04-12 MED ORDER — GLYCOPYRROLATE 0.2 MG/ML IJ SOLN
INTRAMUSCULAR | Status: AC
  Filled 2014-04-12: qty 3

## 2014-04-12 MED ORDER — ROCURONIUM BROMIDE 50 MG/5ML IV SOLN
INTRAVENOUS | Status: AC
  Filled 2014-04-12: qty 1

## 2014-04-12 MED ORDER — LIDOCAINE HCL (CARDIAC) 20 MG/ML IV SOLN
INTRAVENOUS | Status: AC
  Filled 2014-04-12: qty 5

## 2014-04-12 MED ORDER — OXYCODONE HCL 5 MG/5ML PO SOLN: 5.0000 mg | Freq: Once | ORAL | Status: AC | PRN

## 2014-04-12 MED ORDER — BUPIVACAINE-EPINEPHRINE (PF) 0.25% -1:200000 IJ SOLN
INTRAMUSCULAR | Status: AC
  Filled 2014-04-12: qty 30

## 2014-04-12 MED ORDER — OXYCODONE-ACETAMINOPHEN 5-325 MG PO TABS
ORAL_TABLET | ORAL | Status: AC
  Administered 2014-04-12: 2 via ORAL
  Filled 2014-04-12: qty 2

## 2014-04-12 SURGICAL SUPPLY — 41 items
APPLIER CLIP 5 13 M/L LIGAMAX5 (MISCELLANEOUS) ×3
APPLIER CLIP ROT 10 11.4 M/L (STAPLE)
BLADE SURG ROTATE 9660 (MISCELLANEOUS) IMPLANT
CANISTER SUCTION 2500CC (MISCELLANEOUS) ×3 IMPLANT
CHLORAPREP W/TINT 26ML (MISCELLANEOUS) ×3 IMPLANT
CLIP APPLIE 5 13 M/L LIGAMAX5 (MISCELLANEOUS) ×1 IMPLANT
CLIP APPLIE ROT 10 11.4 M/L (STAPLE) IMPLANT
CLOSURE WOUND 1/2 X4 (GAUZE/BANDAGES/DRESSINGS) ×1
COVER MAYO STAND STRL (DRAPES) ×3 IMPLANT
COVER SURGICAL LIGHT HANDLE (MISCELLANEOUS) ×3 IMPLANT
DERMABOND ADVANCED (GAUZE/BANDAGES/DRESSINGS) ×2
DERMABOND ADVANCED .7 DNX12 (GAUZE/BANDAGES/DRESSINGS) ×1 IMPLANT
DRAPE C-ARM 42X72 X-RAY (DRAPES) ×3 IMPLANT
DRAPE UTILITY 15X26 W/TAPE STR (DRAPE) ×6 IMPLANT
DRSG TEGADERM 2-3/8X2-3/4 SM (GAUZE/BANDAGES/DRESSINGS) ×9 IMPLANT
DRSG TEGADERM 4X4.75 (GAUZE/BANDAGES/DRESSINGS) ×3 IMPLANT
ELECT REM PT RETURN 9FT ADLT (ELECTROSURGICAL) ×3
ELECTRODE REM PT RTRN 9FT ADLT (ELECTROSURGICAL) ×1 IMPLANT
GLOVE BIOGEL PI IND STRL 8 (GLOVE) ×1 IMPLANT
GLOVE BIOGEL PI INDICATOR 8 (GLOVE) ×2
GLOVE ECLIPSE 7.5 STRL STRAW (GLOVE) ×3 IMPLANT
GOWN STRL REUS W/ TWL LRG LVL3 (GOWN DISPOSABLE) ×3 IMPLANT
GOWN STRL REUS W/TWL LRG LVL3 (GOWN DISPOSABLE) ×6
KIT BASIN OR (CUSTOM PROCEDURE TRAY) ×3 IMPLANT
KIT ROOM TURNOVER OR (KITS) ×3 IMPLANT
NS IRRIG 1000ML POUR BTL (IV SOLUTION) ×3 IMPLANT
PAD ARMBOARD 7.5X6 YLW CONV (MISCELLANEOUS) ×3 IMPLANT
POUCH SPECIMEN RETRIEVAL 10MM (ENDOMECHANICALS) IMPLANT
SCISSORS LAP 5X35 DISP (ENDOMECHANICALS) ×3 IMPLANT
SET CHOLANGIOGRAPH 5 50 .035 (SET/KITS/TRAYS/PACK) ×3 IMPLANT
SET IRRIG TUBING LAPAROSCOPIC (IRRIGATION / IRRIGATOR) ×3 IMPLANT
SLEEVE ENDOPATH XCEL 5M (ENDOMECHANICALS) ×3 IMPLANT
SPECIMEN JAR SMALL (MISCELLANEOUS) ×3 IMPLANT
STRIP CLOSURE SKIN 1/2X4 (GAUZE/BANDAGES/DRESSINGS) ×2 IMPLANT
SUT MNCRL AB 4-0 PS2 18 (SUTURE) ×3 IMPLANT
TOWEL OR 17X24 6PK STRL BLUE (TOWEL DISPOSABLE) ×3 IMPLANT
TOWEL OR 17X26 10 PK STRL BLUE (TOWEL DISPOSABLE) ×3 IMPLANT
TRAY LAPAROSCOPIC (CUSTOM PROCEDURE TRAY) ×3 IMPLANT
TROCAR XCEL BLUNT TIP 100MML (ENDOMECHANICALS) ×3 IMPLANT
TROCAR XCEL NON-BLD 11X100MML (ENDOMECHANICALS) IMPLANT
TROCAR XCEL NON-BLD 5MMX100MML (ENDOMECHANICALS) ×3 IMPLANT

## 2014-04-12 NOTE — Progress Notes (Signed)
Report called to OR  

## 2014-04-12 NOTE — Transfer of Care (Signed)
Immediate Anesthesia Transfer of Care Note  [facility]  Procedure(s) Performed: Procedure(s): LAPAROSCOPIC CHOLECYSTECTOMY WITH INTRAOPERATIVE CHOLANGIOGRAM (N/A)  Patient Location: PACU  Anesthesia Type:General  Level of Consciousness: awake, alert  and oriented  Airway & Oxygen Therapy: Patient Spontanous Breathing and Patient connected to nasal cannula oxygen  Post-op Assessment: Report given to PACU RN, Post -op Vital signs reviewed and stable and Patient moving all extremities X 4  Post vital signs: Reviewed and stable  Complications: No apparent anesthesia complications

## 2014-04-12 NOTE — Progress Notes (Signed)
Patient is currently asymptomatic.  Has passed a stone.  LFTs are normal, but because of her history will perform IOC at the time of surgery.  Marta LamasJames O. Gae BonWyatt, [name], MD, FACS [phone]--pager [phone]--office [facility]

## 2014-04-12 NOTE — Anesthesia Preprocedure Evaluation (Addendum)
Anesthesia Evaluation  Patient identified by MRN, date of birth, ID band Patient awake    Reviewed: Allergy & Precautions, H&P , NPO status , Patient's Chart, lab work & pertinent test results  Airway Mallampati: [name] TM Distance: >3 FB Neck ROM: Full    Dental no notable dental hx. (+) Teeth Intact, Dental Advisory Given   Pulmonary neg pulmonary ROS,  breath sounds clear to auscultation  Pulmonary exam normal       Cardiovascular negative cardio ROS  Rhythm:Regular Rate:Normal     Neuro/Psych Depression negative neurological ROS     GI/Hepatic negative GI ROS, Neg liver ROS,   Endo/Other  Morbid obesity  Renal/GU negative Renal ROS  negative genitourinary   Musculoskeletal   Abdominal   Peds  Hematology negative hematology ROS (+)   Anesthesia Other Findings   Reproductive/Obstetrics negative OB ROS                         Anesthesia Physical Anesthesia Plan  ASA: [name]  Anesthesia Plan: General   Post-op Pain Management:    Induction: Intravenous  Airway Management Planned: Oral ETT  Additional Equipment:   Intra-op Plan:   Post-operative Plan: Extubation in OR  Informed Consent: I have reviewed the patients History and Physical, chart, labs and discussed the procedure including the risks, benefits and alternatives for the proposed anesthesia with the patient or authorized representative who has indicated his/her understanding and acceptance.   Dental advisory given  Plan Discussed with: CRNA  Anesthesia Plan Comments:         Anesthesia Quick Evaluation

## 2014-04-12 NOTE — Anesthesia Procedure Notes (Signed)
Procedure Name: Intubation Date/Time: 04/12/2014 9:22 AM Performed by: Vita BarleyURNER, Safiyah Cisney E Pre-anesthesia Checklist: Patient identified, Emergency Drugs available, Suction available and Patient being monitored Patient Re-evaluated:Patient Re-evaluated prior to inductionOxygen Delivery Method: Circle system utilized Preoxygenation: Pre-oxygenation with 100% oxygen Intubation Type: IV induction and Rapid sequence Laryngoscope Size: Miller and 2 Grade View: Grade I Tube type: Oral Tube size: 7.5 mm Number of attempts: 1 Airway Equipment and Method: Stylet Placement Confirmation: ETT inserted through vocal cords under direct vision,  positive ETCO2 and breath sounds checked- equal and bilateral Secured at: 21 cm Tube secured with: Tape Dental Injury: Teeth and Oropharynx as per pre-operative assessment

## 2014-04-12 NOTE — Progress Notes (Signed)
Pt back from Surgery. Pt alert and oriented x4. Pt's abdomen and surgical site was assessed. Pt comfortable at the moment. Will continue to monitor.

## 2014-04-12 NOTE — Anesthesia Postprocedure Evaluation (Signed)
  Anesthesia Post-op Note  Patient: Dominique Robinson  Procedure(s) Performed: Procedure(s): LAPAROSCOPIC CHOLECYSTECTOMY WITH INTRAOPERATIVE CHOLANGIOGRAM (N/A)  Patient Location: PACU  Anesthesia Type:General  Level of Consciousness: awake and alert   Airway and Oxygen Therapy: Patient Spontanous Breathing  Post-op Pain: mild  Post-op Assessment: Post-op Vital signs reviewed, Patient's Cardiovascular Status Stable and Respiratory Function Stable  Post-op Vital Signs: Reviewed  Filed Vitals:   04/12/14 1145  BP:   Pulse: 57  Temp: 36.8 C  Resp: 16    Complications: No apparent anesthesia complications

## 2014-04-12 NOTE — Op Note (Addendum)
OPERATIVE REPORT  DATE OF OPERATION:  04/12/2014  PATIENT:  Dominique Robinson  48 y.o. female  PRE-OPERATIVE DIAGNOSIS:  Cholelithiasis and choledocholithiasis  POST-OPERATIVE DIAGNOSIS:  Cholelithiasis and choledocholithiasis  PROCEDURE:  Procedure(s): LAPAROSCOPIC CHOLECYSTECTOMY WITH INTRAOPERATIVE CHOLANGIOGRAM  SURGEON:  Surgeon(s): Cherylynn RidgesJames O Ziggy Chanthavong, MD  ASSISTANT: None  ANESTHESIA:   general  EBL: <30 ml  BLOOD ADMINISTERED: none  DRAINS: none   SPECIMEN:  Source of Specimen:  Gallbladder with stones  COUNTS CORRECT:  YES  PROCEDURE DETAILS: The patient was taken to the operating room and placed on the table in the supine position.  After an adequate endotracheal anesthetic was administered, the patient was prepped with ChloroPrep, and then draped in the usual manner exposing the entire abdomen laterally, inferiorly and up  to the costal margins.  After a proper timeout was performed including identifying the patient and the procedure to be performed, a supraumbilical 1.5cm midline incision was made using a #15 blade.  This was taken down to the fascia which was then incised with a #15 blade.  The edges of the fascia were tented up with Kocher clamps as the preperitoneal space was penetrated with a Kelly clamp into the peritoneum.  Once this was done, a pursestring suture of 0 Vicryl was passed around the fascial opening.  This was subsequently used to secure the [facility] cann[facility] which was passed into the peritoneal cavity.  Once the [facility] cann[facility] was in place, carbon dioxide gas was insufflated into the peritoneal cavity up to a maximal intra-abdominal pressure of 15mm Hg.The laparoscope, with attached camera and light source, was passed into the peritoneal cavity to visualize the direct insertion of two right upper quadrant 5mm cannulas, and a sup-xiphoid 5mm cannula.  Once all cannulas were in place, the dissection was begun.  Two ratcheted graspers were attached to the dome  and infundibulum of the gallbladder and retracted towards the anterior abdominal wall and the right upper quadrant.  Using cautery attached to a dissecting forceps, the peritoneum overlaying the triangle of Chalot and the hepatoduodenal triangle was dissected away exposing the cystic duct and the cystic artery.  The cystic artery was clipped proximally and distally then transected.  A clip was placed on the gallbladder side of the cystic duct, then a cholecystodochotomy made using the laparoscopic scissors.  Through the cholecystodochotomy a Cook catheter was passed to performed a cholangiogram.  The cholangiogram showed Good flow into the duodenum,, and no ductal dilatation..  Once the cholangiogram was completed, the [facility] was removed, and the distal cystic duct was clipped multiple times then transected.  The gallbladder was then dissected out of the hepatic bed without event.  It was retrieved from the abdomen using an EndoCatch bag without event.  Once the gallbladder was removed, the bed was inspected for hemostasis.  Once excellent hemostasis was obtained all gas and fluids were aspirated from above the liver, then the cannulas were removed.  The supraumbilical incision was closed using the pursestring suture which was in place.  0.25% bupivicaine with epinephrine was injected at all sites.  All 10mm or greater cannula sites were close using a running subcuticular stitch of 4-0 Monocryl.  5.750mm cannula sites were closed with Dermabond only.Steri-Strips and Tagaderm were used to complete the dressings at all sites.  At this point all needle, sponge, and instrument counts were correct.The patient was awakened from anesthesia and taken to the PACU in stable condition.  PATIENT DISPOSITION:  PACU - hemodynamically stable.  [name], [name] 7/3/201510:26 AM

## 2014-04-13 MED ORDER — OXYCODONE-ACETAMINOPHEN 5-325 MG PO TABS: 1.0000 | ORAL_TABLET | ORAL | Status: DC | PRN

## 2014-04-13 MED ORDER — MUPIROCIN 2 % EX OINT: 1.0000 "application " | TOPICAL_OINTMENT | Freq: Two times a day (BID) | CUTANEOUS | Status: DC

## 2014-04-13 MED ORDER — CHLORHEXIDINE GLUCONATE CLOTH 2 % EX PADS: 6.0000 | MEDICATED_PAD | Freq: Every day | CUTANEOUS | Status: DC

## 2014-04-13 NOTE — Progress Notes (Signed)
1 Day Post-Op  Subjective: Sore at umbilicus.  Sl nausea with clears this AM.    Objective: Vital signs in last 24 hours: Temp:  [97.6 F (36.4 C)-98.2 F (36.8 C)] 98 F (36.7 C) (07/04 0513) Pulse Rate:  [57-88] 76 (07/04 0513) Resp:  [12-18] 16 (07/04 0513) BP: (119-150)/(71-82) 147/71 mmHg (07/04 0513) SpO2:  [92 %-100 %] 97 % (07/04 0513) Last BM Date: 04/12/14  Intake/Output from previous day: 07/03 0701 - 07/04 0700 In: 1000 [I.V.:1000] Out: 20 [Blood:20] Intake/Output this shift:    General appearance: alert, cooperative and no distress Resp: breathing comfortably GI: soft, approp tender, mildly distended.  wounds c/d/i.  Lab Results:   Recent Labs  04/11/14 0725 04/12/14 0358  WBC 7.8 5.6  HGB 13.2 12.8  HCT 39.4 38.2  PLT 303 273   BMET  Recent Labs  04/11/14 0725 04/12/14 0358  NA 137 142  K 4.6 3.7  CL 97 104  CO2 26 25  GLUCOSE 115* 117*  BUN 10 7  CREATININE 0.59 0.63  CALCIUM 9.7 8.6   PT/INR No results found for this basename: LABPROT, INR,  in the last 72 hours ABG No results found for this basename: PHART, PCO2, PO2, HCO3,  in the last 72 hours  Studies/Results: Dg Cholangiogram Operative  04/12/2014   CLINICAL DATA:  Cholecystectomy  EXAM: INTRAOPERATIVE CHOLANGIOGRAM  TECHNIQUE: Cholangiographic images from the C-arm fluoroscopic device were submitted for interpretation post-operatively. Please see the procedural report for the amount of contrast and the fluoroscopy time utilized.  COMPARISON:  None.  FINDINGS: There is free flow contrast material into the intrahepatic and extrahepatic biliary tree. Free flow contrast material into the small bowel is noted. No definitive filling defects are noted. 24 seconds of fluoroscopy was utilized.   Electronically Signed   By: Alcide CleverMark  Lukens M.D.   On: 04/12/2014 10:15   Mr 3d Recon At Scanner  04/11/2014   CLINICAL DATA:  Right upper quadrant abdominal pain. Cholelithiasis and choledocholithiasis  on prior ultrasound and CT.  EXAM: MRI ABDOMEN WITHOUT AND WITH CONTRAST (INCLUDING MRCP)  TECHNIQUE: Multiplanar multisequence MR imaging of the abdomen was performed both before and after the administration of intravenous contrast. Heavily T2-weighted images of the biliary and pancreatic ducts were obtained, and three-dimensional MRCP images were rendered by post processing.  CONTRAST:  20mL MULTIHANCE GADOBENATE DIMEGLUMINE 529 MG/ML IV SOLN  COMPARISON:  Abdominal CT and ultrasound of earlier today.  FINDINGS: Normal heart size without pericardial or pleural effusion. Normal liver, spleen, stomach, pancreas, adrenal glands, kidneys.  Multiple small gallstones without wall thickening or pericholecystic fluid. Similar to mild improvement in borderline intrahepatic biliary ductal dilatation. The common duct dilatation has resolved. For example, the common duct measures 6 mm on image 28/series 3 in the pancreatic head. 9 mm at the same level on the prior CT. Maximally 6 mm on image 53/ series 8. No evidence of residual choledocholithiasis. No pancreatic ductal dilatation.  No abdominal adenopathy or ascites. Normal small bowel loops. Colonic stool burden suggests constipation. Probable hemangioma within the T11 vertebral body.  IMPRESSION: 1. Resolution of common duct dilatation with presumed passage of common duct stone. 2. Cholelithiasis. 3.  Possible constipation.   Electronically Signed   By: Jeronimo GreavesKyle  Talbot M.D.   On: 04/11/2014 13:48   Mr Abd W/wo Cm/mrcp  04/11/2014   CLINICAL DATA:  Right upper quadrant abdominal pain. Cholelithiasis and choledocholithiasis on prior ultrasound and CT.  EXAM: MRI ABDOMEN WITHOUT AND WITH CONTRAST (  INCLUDING MRCP)  TECHNIQUE: Multiplanar multisequence MR imaging of the abdomen was performed both before and after the administration of intravenous contrast. Heavily T2-weighted images of the biliary and pancreatic ducts were obtained, and three-dimensional MRCP images were  rendered by post processing.  CONTRAST:  20mL MULTIHANCE GADOBENATE DIMEGLUMINE 529 MG/ML IV SOLN  COMPARISON:  Abdominal CT and ultrasound of earlier today.  FINDINGS: Normal heart size without pericardial or pleural effusion. Normal liver, spleen, stomach, pancreas, adrenal glands, kidneys.  Multiple small gallstones without wall thickening or pericholecystic fluid. Similar to mild improvement in borderline intrahepatic biliary ductal dilatation. The common duct dilatation has resolved. For example, the common duct measures 6 mm on image 28/series 3 in the pancreatic head. 9 mm at the same level on the prior CT. Maximally 6 mm on image 53/ series 8. No evidence of residual choledocholithiasis. No pancreatic ductal dilatation.  No abdominal adenopathy or ascites. Normal small bowel loops. Colonic stool burden suggests constipation. Probable hemangioma within the T11 vertebral body.  IMPRESSION: 1. Resolution of common duct dilatation with presumed passage of common duct stone. 2. Cholelithiasis. 3.  Possible constipation.   Electronically Signed   By: Jeronimo GreavesKyle  Talbot M.D.   On: 04/11/2014 13:48    Anti-infectives: Anti-infectives   Start     Dose/Rate Route Frequency Ordered Stop   04/11/14 1330  metroNIDAZOLE (FLAGYL) IVPB 500 mg  Status:  Discontinued     500 mg 100 mL/hr over 60 Minutes Intravenous Every 8 hours 04/11/14 1226 04/12/14 1210   04/11/14 0800  ciprofloxacin (CIPRO) IVPB 400 mg     400 mg 200 mL/hr over 60 Minutes Intravenous Every 12 hours 04/11/14 0659        Assessment/Plan: s/p Procedure(s): LAPAROSCOPIC CHOLECYSTECTOMY WITH INTRAOPERATIVE CHOLANGIOGRAM (N/A) Advance diet maybe home after lunch if nausea resolves and soreness improves.  LOS: 3 days    Dominique Robinson 04/13/2014

## 2014-04-13 NOTE — Discharge Instructions (Signed)
[facility] ______[facility] LAPAROSCOPIC SURGERY: POST OP INSTRUCTIONS Always review your discharge instruction sheet given to you by the facility where your surgery was performed. IF YOU HAVE DISABILITY OR FAMILY LEAVE FORMS, YOU MUST BRING THEM TO THE OFFICE FOR PROCESSING.   DO NOT GIVE THEM TO YOUR DOCTOR.  1. A prescription for pain medication may be given to you upon discharge.  Take your pain medication as prescribed, if needed.  If narcotic pain medicine is not needed, then you may take acetaminophen (Tylenol) or ibuprofen (Advil) as needed. 2. Take your usually prescribed medications unless otherwise directed. 3. If you need a refill on your pain medication, please contact your pharmacy.  They will contact our office to request authorization. Prescriptions will not be filled after 5pm or on week-ends. 4. You should follow a light diet the first few days after arrival home, such as soup and crackers, etc.  Be sure to include lots of fluids daily. 5. Most patients will experience some swelling and bruising in the area of the incisions.  Ice packs will help.  Swelling and bruising can take several days to resolve.  6. It is common to experience some constipation if taking pain medication after surgery.  Increasing fluid intake and taking a stool softener (such as Colace) will usually help or prevent this problem from occurring.  A mild laxative (Milk of Magnesia or Miralax) should be taken according to package instructions if there are no bowel movements after 48 hours. 7. Unless discharge instructions indicate otherwise, you may remove your bandages 24-48 hours after surgery, and you may shower at that time.  You may have steri-strips (small skin tapes) in place directly over the incision.  These strips should be left on the skin for 7-10 days.  If your surgeon used skin glue on the incision, you may shower in 24 hours.  The glue will flake off over the next 2-3 weeks.  Any sutures or  staples will be removed at the office during your follow-up visit. 8. ACTIVITIES:  You may resume regular (light) daily activities beginning the next day--such as daily self-care, walking, climbing stairs--gradually increasing activities as tolerated.  You may have sexual intercourse when it is comfortable.  Refrain from any heavy lifting or straining until approved by your doctor. a. You may drive when you are no longer taking prescription pain medication, you can comfortably wear a seatbelt, and you can safely maneuver your car and apply brakes. b. RETURN TO WORK:  TBD 9. You should see your doctor in the office for a follow-up appointment approximately 2-3 weeks after your surgery.  Make sure that you call for this appointment within a day or two after you arrive home to insure a convenient appointment time. 10. OTHER INSTRUCTIONS: __________________________________________________________________________________________________________________________ __________________________________________________________________________________________________________________________ WHEN TO CALL YOUR DOCTOR: 1. Fever over 101.0 2. Inability to urinate 3. Continued bleeding from incision. 4. Increased pain, redness, or drainage from the incision. 5. Increasing abdominal pain  The clinic staff is available to answer your questions during regular business hours.  Please dont hesitate to call and ask to speak to one of the nurses for clinical concerns.  If you have a medical emergency, go to the nearest emergency room or call 911.  A [facility] from [facility] is always on call at the hospital. [address], [address], [place], [place]  [address] ? [address] [address], [place], [place]   [address] [phone] ? [phone] ? FAX [phone] Web site:  [email or web address] °

## 2014-04-13 NOTE — Progress Notes (Addendum)
[facility] order.  Discharge instructions reviewed and discussed with the patient, all questions and concerns answered. Copy of instructions and scripts given to patient.    Medication List    STOP taking these medications       acetaminophen 325 MG tablet  Commonly known as:  TYLENOL      TAKE these medications       aspirin 325 MG tablet  Take 650 mg by mouth daily.     bismuth subsalicylate 262 MG/15ML suspension  Commonly known as:  PEPTO BISMOL  Take 30 mLs by mouth every 6 (six) hours as needed for indigestion.     FISH OIL PO  Take 1 capsule by mouth daily.     ibuprofen 200 MG tablet  Commonly known as:  ADVIL,MOTRIN  Take 400 mg by mouth every 6 (six) hours as needed for mild pain.     MULTI VITAMIN DAILY PO  Take 1 tablet by mouth daily.     oxyCODONE-acetaminophen 5-325 MG per tablet  Commonly known as:  PERCOCET/ROXICET  Take 1-2 tablets by mouth every 4 (four) hours as needed for moderate pain.     VITAMIN C PO  Take 1 tablet by mouth daily.        Patients skin is clean, dry and intact, no evidence of skin break down. Surgical dressing clean, dry and intact, incision care reviewed with patient. IV site discontinued and catheter remains intact. Site without signs and symptoms of complications. Dressing and pressure applied.  Patient escorted to car by NT in a wheelchair,  no distress noted upon discharge.  Julien NordmannJackson, [name] [facility] 04/13/2014 6:23PM

## 2014-04-15 ENCOUNTER — Encounter (HOSPITAL_COMMUNITY): Payer: Self-pay | Admitting: General Surgery

## 2014-04-15 DIAGNOSIS — K810 Acute cholecystitis: Secondary | ICD-10-CM | POA: Diagnosis present

## 2014-04-15 NOTE — Discharge Summary (Signed)
Physician Discharge Summary  Patient ID: [name] MRN: [id number] DOB/AGE: May 03, 1966 48 y.o.  Admit date: 04/10/2014 Discharge date: 04/13/2014  Discharge Diagnoses Patient Active Problem List   Diagnosis Date Noted  . Cholecystitis, acute 04/15/2014  . Choledocholithiasis 04/11/2014    Consultants Dr. Violeta GelinasBurke Thompson for general surgery  Dr. Carman ChingJames Edwards for gastroenterology   Procedures Laparoscopic cholecystectomy with intraoperative cholangiogram by Dr. Jimmye NormanJames Wyatt   HPI: Dominique Robinson presented to the ER because of abdominal pain. Her abdominal pain started the night prior initially the epigastric area and later started to radiate to the back and right upper quadrant. She did not have any nausea or vomiting but had 2 episodes of diarrhea which was not bloody. She denieed any associated chest pain or shortness of breath. In the ER she had a CT scan of the abdomen and pelvis which showed choledocholithiasis and a sonogram that showed cholelithiasis. Gastroenterology and general surgery were consulted and she was admitted by internal medicine.   Hospital Course: GI recommended a MRCP which was done and showed likely passage of the common duct stone. She was taken to surgery which was uneventful. Her post-operative period was only marked by some mild nausea which cleared by the next afternoon and she was discharged home in good condition.      Medication List    STOP taking these medications       acetaminophen 325 MG tablet  Commonly known as:  TYLENOL      TAKE these medications       aspirin 325 MG tablet  Take 650 mg by mouth daily.     bismuth subsalicylate 262 MG/15ML suspension  Commonly known as:  PEPTO BISMOL  Take 30 mLs by mouth every 6 (six) hours as needed for indigestion.     FISH OIL PO  Take 1 capsule by mouth daily.     ibuprofen 200 MG tablet  Commonly known as:  ADVIL,MOTRIN  Take 400 mg by mouth every 6 (six) hours as needed for mild pain.      MULTI VITAMIN DAILY PO  Take 1 tablet by mouth daily.     oxyCODONE-acetaminophen 5-325 MG per tablet  Commonly known as:  PERCOCET/ROXICET  Take 1-2 tablets by mouth every 4 (four) hours as needed for moderate pain.     VITAMIN C PO  Take 1 tablet by mouth daily.             Follow-up Information   Follow up with [facility] On 05/07/2014. (1:30PM)    Contact information:   [address]   [place] [address] [phone]       Signed: Freeman CaldronMichael J. Colene Mines, PA-C Pager: [phone] 04/15/2014, 10:15 AM

## 2014-04-15 NOTE — Discharge Summary (Signed)
Seen, agree with above.   

## 2014-04-22 ENCOUNTER — Telehealth (INDEPENDENT_AMBULATORY_CARE_PROVIDER_SITE_OTHER): Payer: Self-pay

## 2014-04-22 ENCOUNTER — Other Ambulatory Visit (INDEPENDENT_AMBULATORY_CARE_PROVIDER_SITE_OTHER): Payer: Self-pay | Admitting: General Surgery

## 2014-04-22 MED ORDER — OXYCODONE-ACETAMINOPHEN 5-325 MG PO TABS: 1.0000 | ORAL_TABLET | ORAL | Status: DC | PRN

## 2014-04-22 NOTE — Telephone Encounter (Addendum)
Patient states she is having umbilical pain rates 6 , out of oxycodone . Denies temp , constipation, diarrhea .Marland Kitchen. She has try tylenol and ibuprofen without  relief . Advised it will be 230p before we will know if authorized and we will call her with instructions

## 2014-04-22 NOTE — Telephone Encounter (Signed)
Informed pt that she has a Rx for Percocet 5/325mg  #20. Rx will be ready for her to pick up at the front desk. Pt verbalized understanding.

## 2014-05-07 ENCOUNTER — Encounter (INDEPENDENT_AMBULATORY_CARE_PROVIDER_SITE_OTHER): Payer: Self-pay

## 2014-05-07 ENCOUNTER — Ambulatory Visit (INDEPENDENT_AMBULATORY_CARE_PROVIDER_SITE_OTHER): Payer: 59 | Admitting: General Surgery

## 2014-05-07 VITALS — BP 122/80 | HR 76 | Temp 98.2°F | Ht 70.0 in | Wt 229.0 lb

## 2014-05-07 DIAGNOSIS — K811 Chronic cholecystitis: Secondary | ICD-10-CM

## 2014-05-07 NOTE — Patient Instructions (Signed)
Follow up as needed

## 2014-05-07 NOTE — Progress Notes (Signed)
[facility] 1966-04-21 [id number] 05/07/2014   Asencion Partridgeatricia Ann Samul DadaWaldie is a 48 y.o. female who had a laparoscopic cholecystectomy with intraoperative cholangiogram by Dr. Lindie SpruceWyatt.  The pathology report confirmed chronic cholecystitis.  The patient reports that they are feeling well with normal bowel movements and good appetite.  The pre-operative symptoms of abdominal pain, nausea, and vomiting have resolved.    Physical examination - Incisions appear well-healed with no sign of infection or bleeding.   Abdomen - soft, non-tender  Impression:  s/p laparoscopic cholecystectomy  Plan:  She may resume a regular diet and full activity.  She may follow-up on a PRN basis.

## 2014-08-15 ENCOUNTER — Encounter (INDEPENDENT_AMBULATORY_CARE_PROVIDER_SITE_OTHER): Payer: Self-pay | Admitting: General Surgery

## 2014-08-15 ENCOUNTER — Other Ambulatory Visit (INDEPENDENT_AMBULATORY_CARE_PROVIDER_SITE_OTHER): Payer: Self-pay

## 2015-10-05 IMAGING — CT CT ABD-PELV W/ CM
2 of 4 series · 4 of 46 positions shown, 6 images · IV contrast (Iodine)
Comparison: Prior CT from 01/24/2014

CLINICAL DATA: Right abdominal pain and flank pain. History of
diverticulitis.

EXAM:
CT ABDOMEN AND PELVIS WITH CONTRAST
TECHNIQUE: Multidetector CT imaging of the abdomen and pelvis was performed
using the standard protocol following bolus administration of
intravenous contrast.
CONTRAST:  100mL OMNIPAQUE IOHEXOL 300 MG/ML  SOLN

[Series 206: cor · coronal · 0.50mm/px · 3 of 90 slices shown, 4 images]
[im 20/90  soft-tissue]
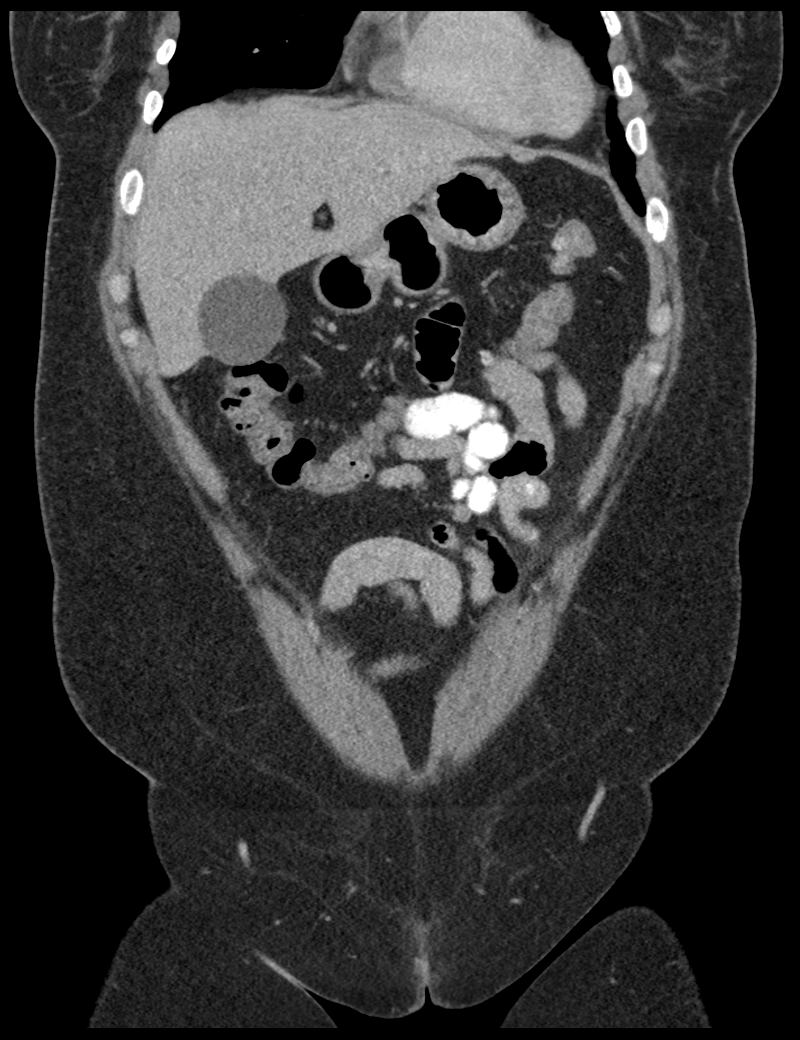
[im 20/90  bone]
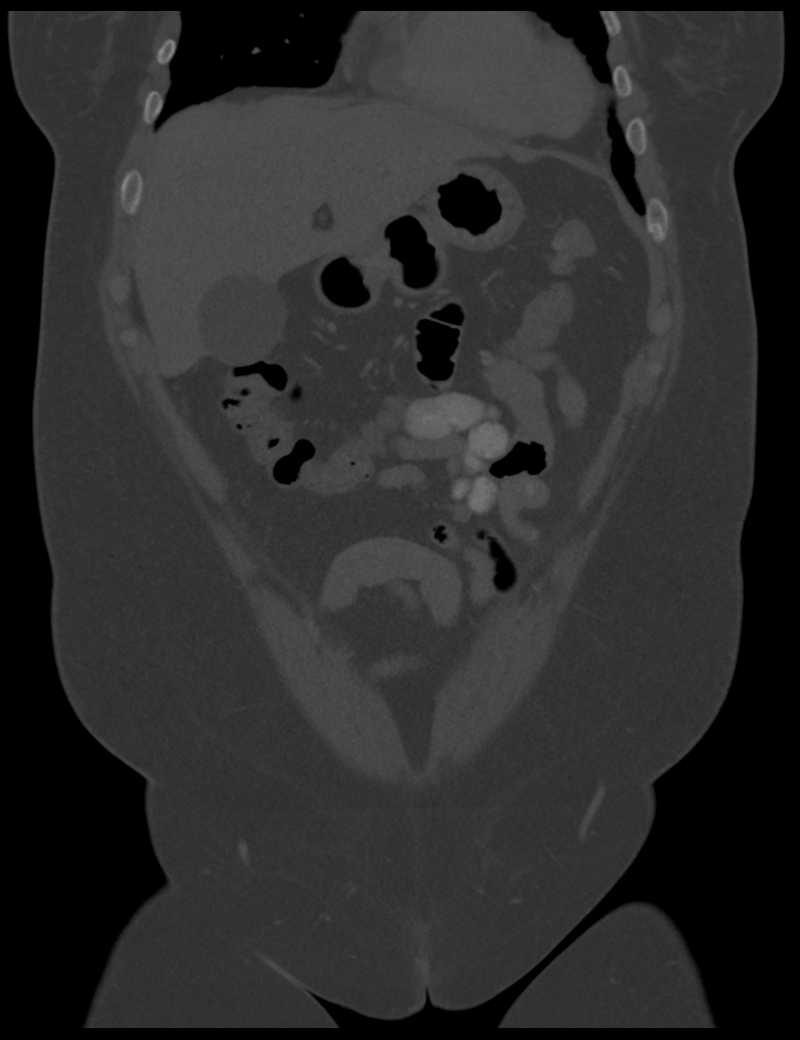
[im 50/90  soft-tissue]
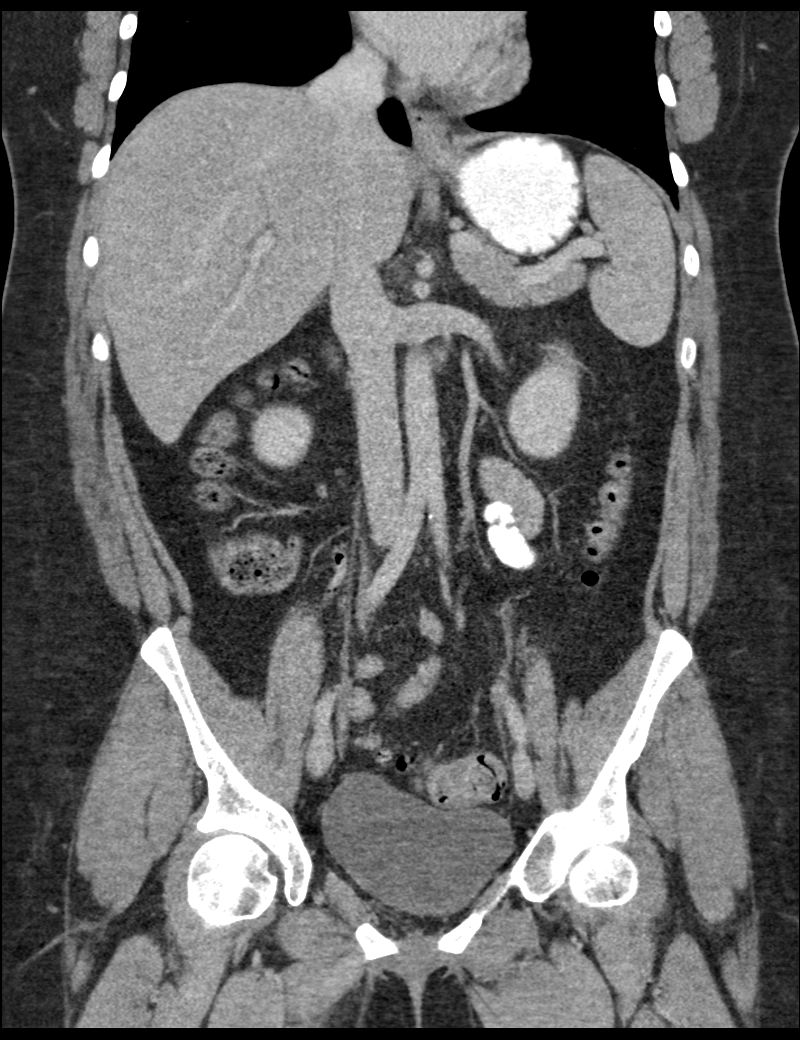
[im 70/90  soft-tissue]
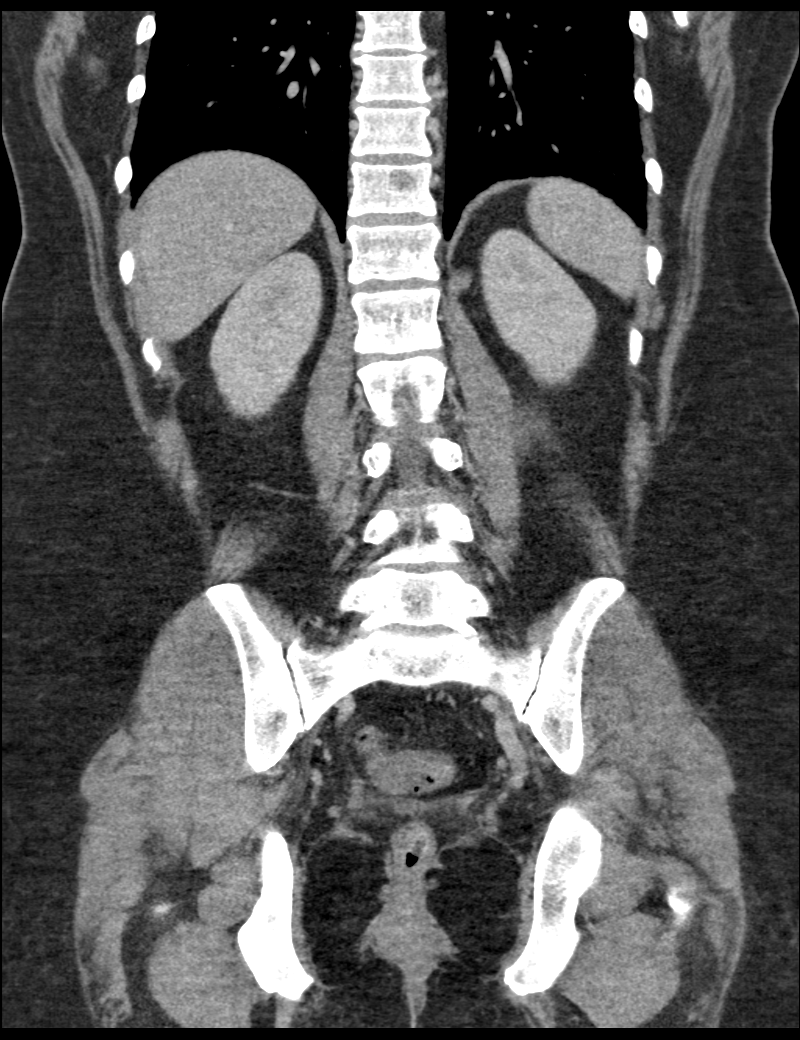

[Series 207: sag · sagittal · 0.50mm/px · 1 of 136 slices shown, 2 images]
[im 46/136  soft-tissue]
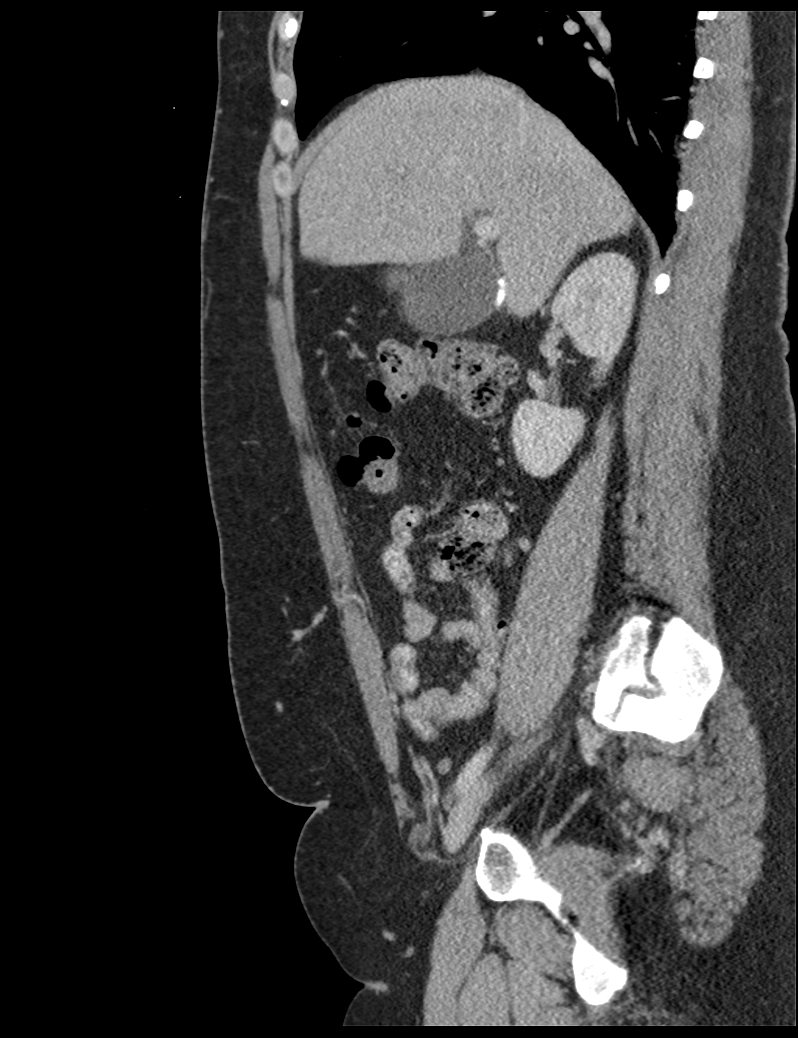
[im 46/136  bone]
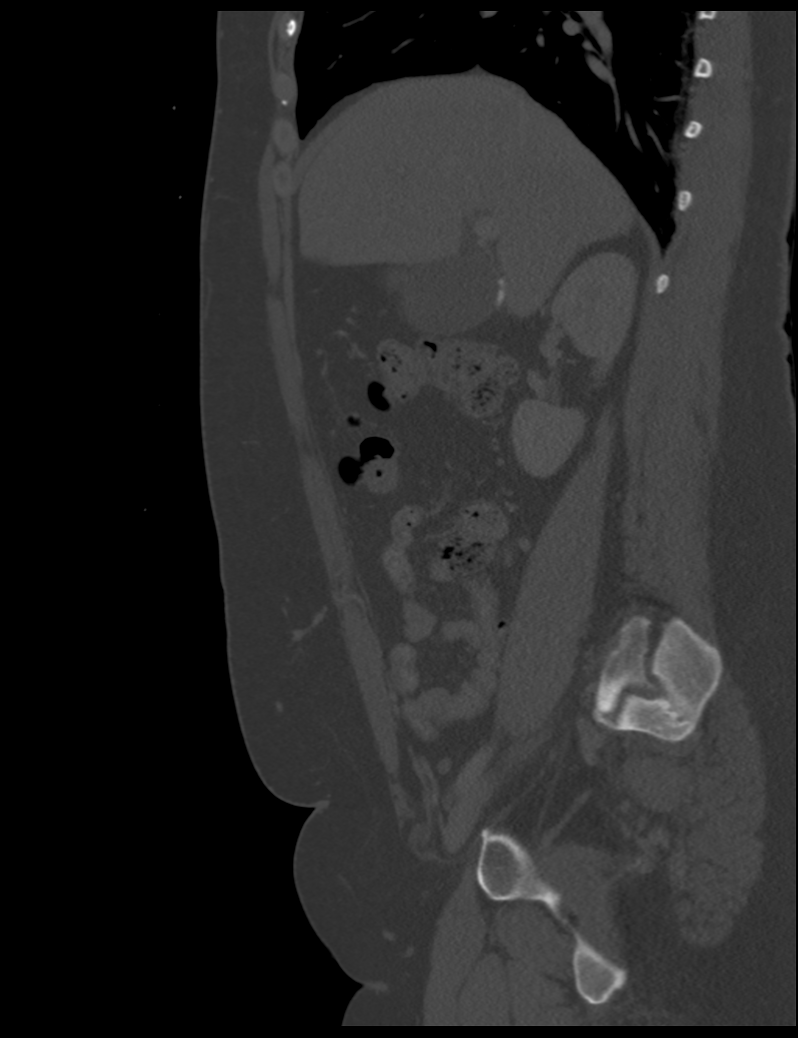

[4 of 46 positions shown; findings below may reference images not displayed]

FINDINGS: Mild subsegmental atelectasis seen dependently within the visualized
lung bases.

Liver demonstrates a normal contrast enhanced appearance. Layering
gallstones present within the gallbladder lumen. No overt evidence
of acute cholecystitis at this time. 5 mm stone is present at the
distal common bile duct/ampulla (series 201, image 34). The common
bile duct is mildly dilated up to 9 mm. Mild intrahepatic biliary
dilatation is present. No pancreatic ductal dilatation.

The spleen, adrenal glands, and pancreas demonstrate a normal
contrast enhanced appearance.

Kidneys are equal in size with symmetric enhancement. No
nephrolithiasis, hydronephrosis, or focal enhancing renal mass.

Stomach within normal limits. No evidence of bowel obstruction.
Appendix is normal. Colonic diverticulosis present without acute
diverticulitis.

Bladder within normal limits. Uterus is absent. Ovaries within
normal limits.

No free air or fluid. No enlarged intra-abdominal pelvic lymph
nodes.

Normal intravascular enhancement seen throughout the abdomen and
pelvis.

No acute osseous abnormality. No worrisome lytic or blastic osseous
lesions.
IMPRESSION: 1. Cholelithiasis with impacted 5 mm stone in the distal common bile
duct near the ampulla with mild intra and extrahepatic biliary
dilatation. No overt CT evidence of acute cholecystitis at this
time. Correlation with right upper quadrant ultrasound may be
helpful for further evaluation.
2. No other acute intra-abdominal pelvic process.

## 2015-10-06 IMAGING — RF DG CHOLANGIOGRAM OPERATIVE
1 series · 8 of 8 positions shown · non-contrast
Comparison: None.

CLINICAL DATA: Cholecystectomy

EXAM:
INTRAOPERATIVE CHOLANGIOGRAM
TECHNIQUE: Cholangiographic images from the C-arm fluoroscopic device were
submitted for interpretation post-operatively. Please see the
procedural report for the amount of contrast and the fluoroscopy
time utilized.

[Series 1: run · 2 acquisitions, 8 frames shown]
[im 1/2]
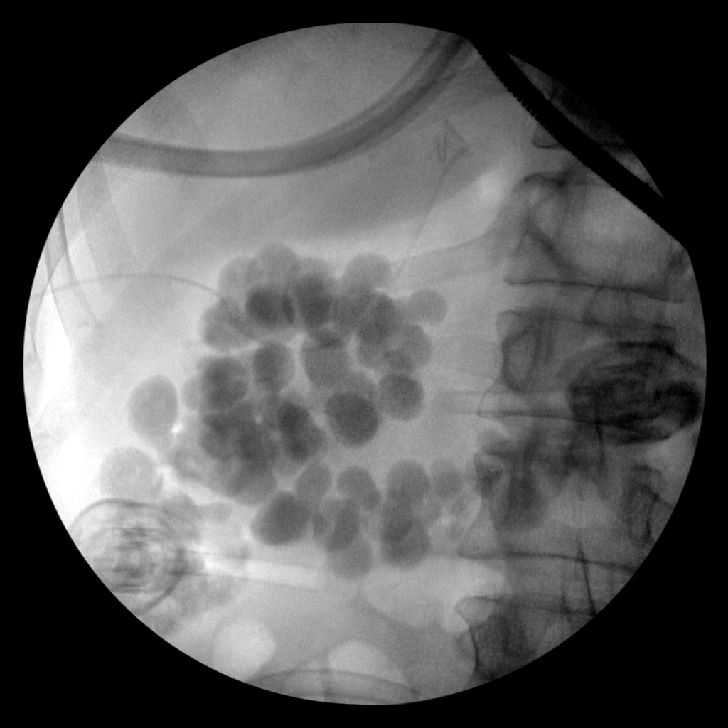
[im 1/2]
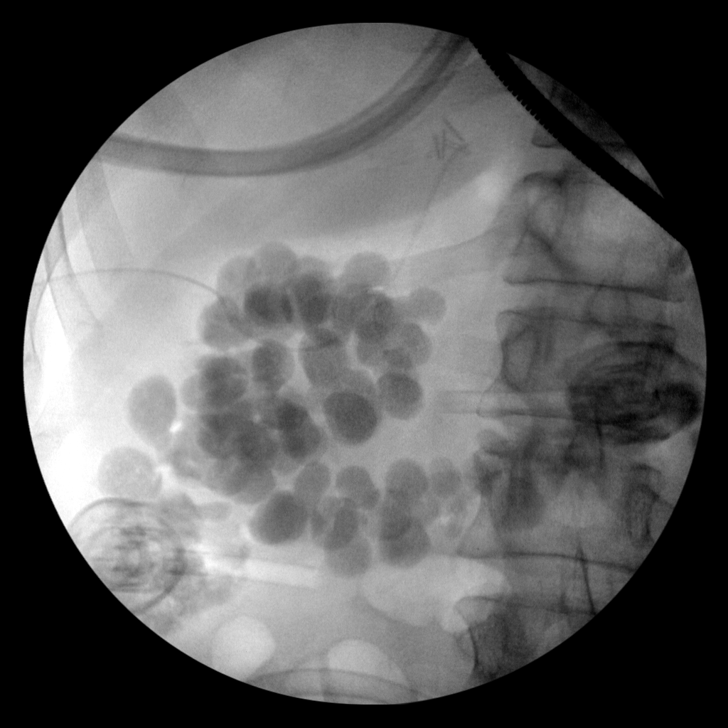
[im 1/2]
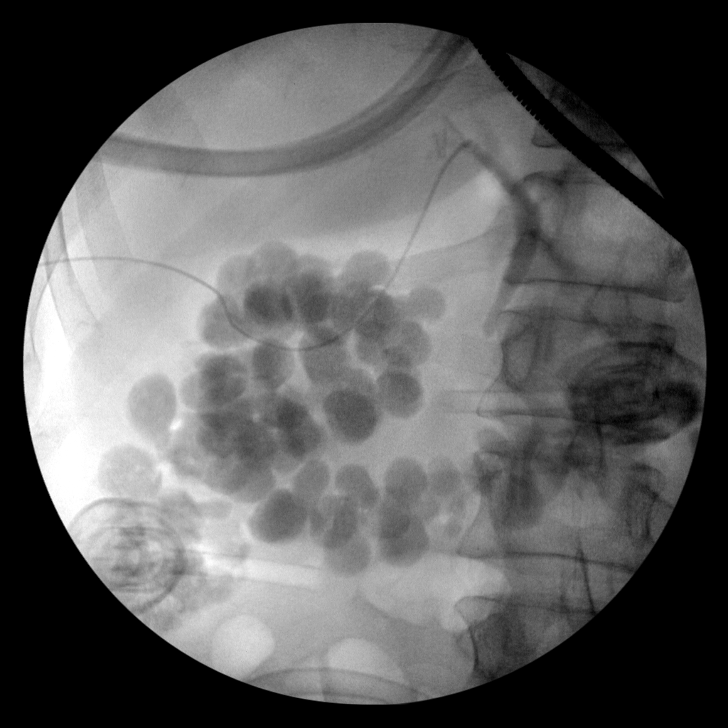
[im 1/2]
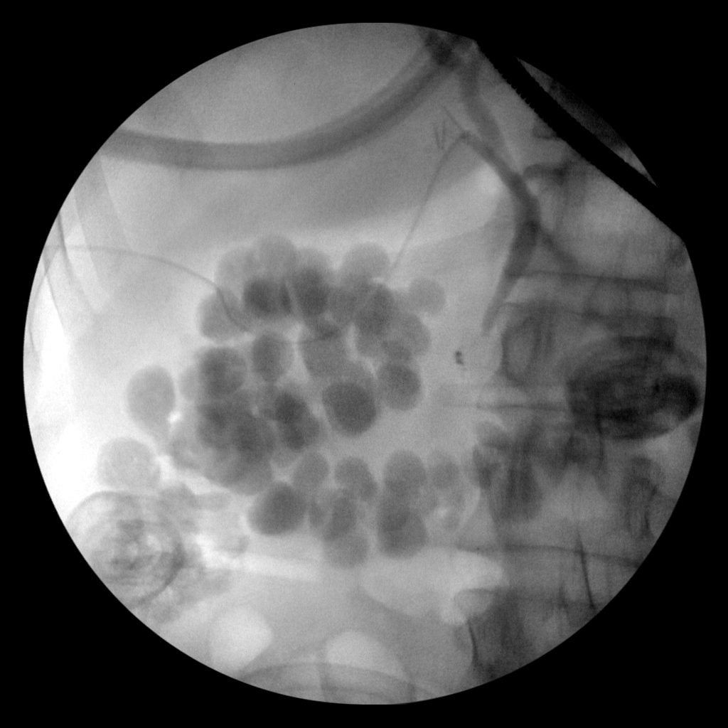
[im 2/2]
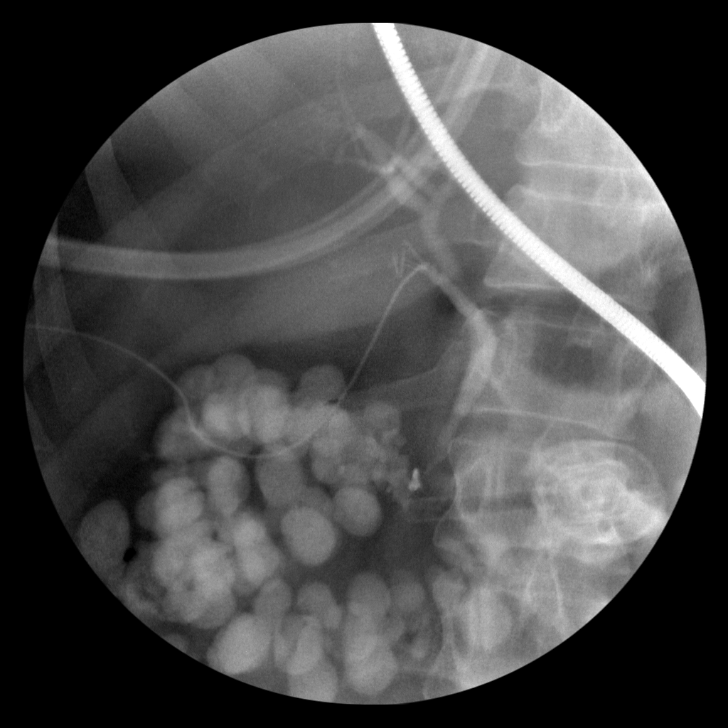
[im 2/2]
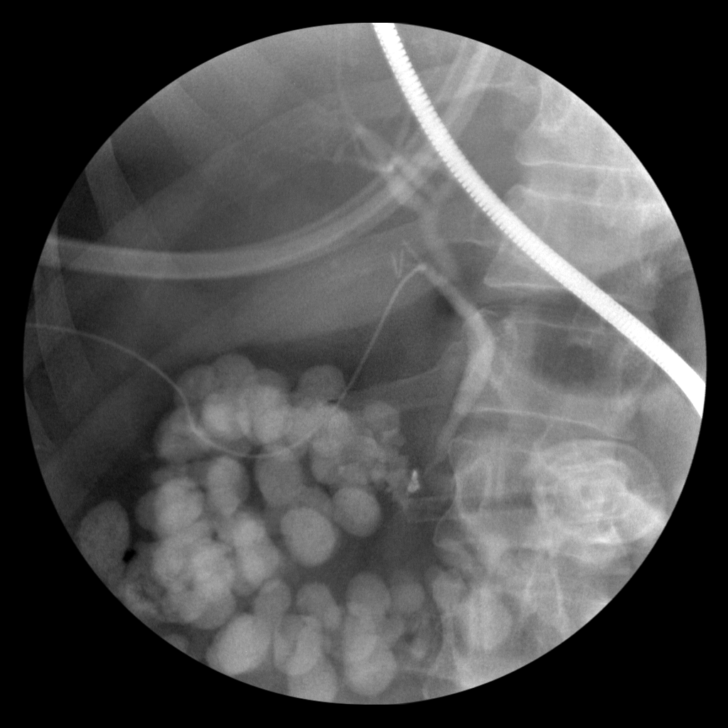
[im 2/2]
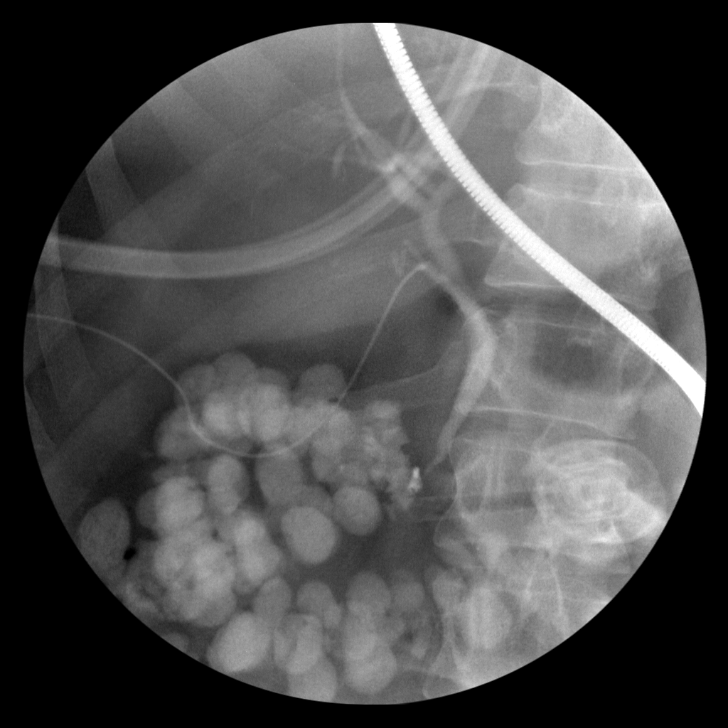
[im 2/2]
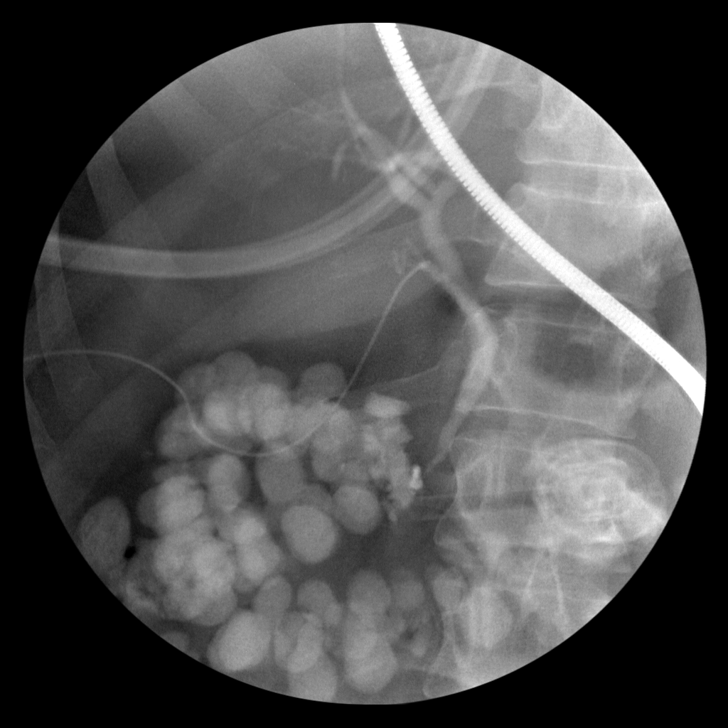

[8 of 8 positions shown; findings below may reference images not displayed]

FINDINGS: There is free flow contrast material into the intrahepatic and
extrahepatic biliary tree. Free flow contrast material into the
small bowel is noted. No definitive filling defects are noted. 24
seconds of fluoroscopy was utilized.
